# Patient Record
Sex: Male | Born: 1980
Health system: Southern US, Community
[De-identification: ages and names within clinical notes are randomized; demographics above are authoritative.]

## PROBLEM LIST (undated history)

## (undated) DIAGNOSIS — Z800 Family history of malignant neoplasm of digestive organs: Secondary | ICD-10-CM

## (undated) DIAGNOSIS — R0989 Other specified symptoms and signs involving the circulatory and respiratory systems: Secondary | ICD-10-CM

## (undated) DIAGNOSIS — Z808 Family history of malignant neoplasm of other organs or systems: Secondary | ICD-10-CM

## (undated) DIAGNOSIS — E785 Hyperlipidemia, unspecified: Secondary | ICD-10-CM

## (undated) DIAGNOSIS — Z803 Family history of malignant neoplasm of breast: Secondary | ICD-10-CM

## (undated) DIAGNOSIS — Z8481 Family history of carrier of genetic disease: Secondary | ICD-10-CM

## (undated) HISTORY — DX: Family history of carrier of genetic disease: Z84.81

## (undated) HISTORY — DX: Family history of malignant neoplasm of digestive organs: Z80.0

## (undated) HISTORY — DX: Hyperlipidemia, unspecified: E78.5

## (undated) HISTORY — DX: Family history of malignant neoplasm of breast: Z80.3

## (undated) HISTORY — DX: Other specified symptoms and signs involving the circulatory and respiratory systems: R09.89

## (undated) HISTORY — DX: Family history of malignant neoplasm of other organs or systems: Z80.8

## (undated) HISTORY — PX: SKIN GRAFT: SHX250

---

## 2012-06-22 ENCOUNTER — Emergency Department (HOSPITAL_COMMUNITY): Payer: Worker's Compensation

## 2012-06-22 ENCOUNTER — Encounter (HOSPITAL_COMMUNITY): Payer: Self-pay | Admitting: *Deleted

## 2012-06-22 ENCOUNTER — Emergency Department (HOSPITAL_COMMUNITY)
Admission: EM | Admit: 2012-06-22 | Discharge: 2012-06-22 | Disposition: A | Discharge status: Home / Self Care | Payer: Worker's Compensation | Attending: Emergency Medicine | Admitting: Emergency Medicine

## 2012-06-22 DIAGNOSIS — R55 Syncope and collapse: Secondary | ICD-10-CM | POA: Insufficient documentation

## 2012-06-22 DIAGNOSIS — R5383 Other fatigue: Secondary | ICD-10-CM | POA: Insufficient documentation

## 2012-06-22 DIAGNOSIS — R5381 Other malaise: Secondary | ICD-10-CM | POA: Insufficient documentation

## 2012-06-22 DIAGNOSIS — R296 Repeated falls: Secondary | ICD-10-CM | POA: Insufficient documentation

## 2012-06-22 DIAGNOSIS — S1093XA Contusion of unspecified part of neck, initial encounter: Secondary | ICD-10-CM | POA: Insufficient documentation

## 2012-06-22 DIAGNOSIS — S0003XA Contusion of scalp, initial encounter: Secondary | ICD-10-CM | POA: Insufficient documentation

## 2012-06-22 LAB — CBC WITH DIFFERENTIAL/PLATELET
Basophils Absolute: 0 10*3/uL (ref 0.0–0.1)
Basophils Relative: 0 % (ref 0–1)
Eosinophils Absolute: 0.3 10*3/uL (ref 0.0–0.7)
Hemoglobin: 14 g/dL (ref 13.0–17.0)
MCH: 30.6 pg (ref 26.0–34.0)
MCHC: 33.8 g/dL (ref 30.0–36.0)
Monocytes Relative: 9 % (ref 3–12)
Neutrophils Relative %: 52 % (ref 43–77)
RDW: 13.3 % (ref 11.5–15.5)

## 2012-06-22 LAB — BASIC METABOLIC PANEL
BUN: 17 mg/dL (ref 6–23)
Creatinine, Ser: 0.93 mg/dL (ref 0.50–1.35)
GFR calc Af Amer: >90 mL/min (ref >90)
GFR calc non Af Amer: >90 mL/min (ref >90)
Potassium: 3.9 mEq/L (ref 3.5–5.1)

## 2012-06-22 LAB — URINALYSIS, ROUTINE W REFLEX MICROSCOPIC
Bilirubin Urine: NEGATIVE
Ketones, ur: NEGATIVE mg/dL
Leukocytes, UA: NEGATIVE
Nitrite: NEGATIVE
Protein, ur: 30 mg/dL — AB
Urobilinogen, UA: 1 mg/dL (ref 0.0–1.0)
pH: 7 (ref 5.0–8.0)

## 2012-06-22 LAB — TROPONIN I: Troponin I: <0.3 ng/mL (ref <0.30)

## 2012-06-22 MED ORDER — SODIUM CHLORIDE 0.9 % IV SOLN
INTRAVENOUS | Status: DC
  Administered 2012-06-22: 1000 mL via INTRAVENOUS

## 2012-06-22 MED ORDER — SODIUM CHLORIDE 0.9 % IV BOLUS (SEPSIS)
1000.0000 mL | Freq: Once | INTRAVENOUS | Status: AC
  Administered 2012-06-22: 1000 mL via INTRAVENOUS

## 2012-06-22 NOTE — ED Provider Notes (Signed)
History     CSN: [id number]  Arrival date & time 06/22/12  1753   First MD Initiated Contact with Patient 06/22/12 1755      Chief Complaint  Patient presents with  . Near Syncope    (Consider location/radiation/quality/duration/timing/severity/associated sxs/prior treatment) HPI Comments: Stuart Newman is a 31 y.o. Male who was at work today, when his family members came to visit him. At that time, they noticed him walking then falling, striking his head on a ping-pong table immediately. He tried to get up and fell, again. He did, that a third time. He hit his head on the floor, and on the door of a Youth worker. There is no report of loss of consciousness. Reportedly, his blood pressure was taken and was "70 over palp.". Patient feels back to normal. His wife states that he has been working too much. Patient states he works about 100 hours a week. Today, to eat; he has had only a protein bar and a hot dog. There are no known aggravating or palliative factors. He has not had episodes like this previously.  The history is provided by the patient and a relative.    History reviewed. No pertinent past medical history.  History reviewed. No pertinent past surgical history.  No family history on file.  History  Substance Use Topics  . Smoking status: Not on file  . Smokeless tobacco: Not on file  . Alcohol Use: Not on file      Review of Systems  All other systems reviewed and are negative.    Allergies  Penicillins  Home Medications  No current outpatient prescriptions on file.  BP 125/57  Pulse 81  Temp 98.1 F (36.7 C) (Oral)  Resp 21  SpO2 100%  Physical Exam  Nursing note and vitals reviewed. Constitutional: He is oriented to person, place, and time. He appears well-developed and well-nourished.       He is robust appearing  HENT:  Head: Normocephalic.  Right Ear: External ear normal.  Left Ear: External ear normal.       Small contusions left and right  parietal, no abrasion or laceration  Eyes: Conjunctivae and EOM are normal. Pupils are equal, round, and reactive to light.  Neck: Normal range of motion and phonation normal. Neck supple.  Cardiovascular: Normal rate, regular rhythm, normal heart sounds and intact distal pulses.   Pulmonary/Chest: Effort normal and breath sounds normal. He exhibits no bony tenderness.  Abdominal: Soft. Normal appearance. There is no tenderness.  Musculoskeletal: Normal range of motion.  Neurological: He is alert and oriented to person, place, and time. He has normal strength. No cranial nerve deficit or sensory deficit. He exhibits normal muscle tone. Coordination normal.  Skin: Skin is warm, dry and intact.  Psychiatric: He has a normal mood and affect. His behavior is normal. Judgment and thought content normal.    ED Course  Procedures (including critical care time)      Date: 06/22/2012  Rate: 82  Rhythm: normal sinus rhythm  QRS Axis: normal  Intervals: normal  ST/T Wave abnormalities: normal  Conduction Disutrbances:none  Narrative Interpretation:   Old EKG Reviewed: none available   Labs Reviewed  URINALYSIS, ROUTINE W REFLEX MICROSCOPIC - Abnormal; Notable for the following:    APPearance CLOUDY (*)     Protein, ur 30 (*)     All other components within normal limits  BASIC METABOLIC PANEL - Abnormal; Notable for the following:    Glucose, Bld 104 (*)  All other components within normal limits  URINE MICROSCOPIC-ADD ON - Abnormal; Notable for the following:    Casts GRANULAR CAST (*)     All other components within normal limits  CBC WITH DIFFERENTIAL  TROPONIN I   Dg Chest 2 View  06/22/2012  *RADIOLOGY REPORT*  Clinical Data: 31 year old male with weakness.  Fall.  CHEST - 2 VIEW  Comparison: None.  Findings: Lung volumes are within normal limits.  Mild eventration of the right hemidiaphragm.  Cardiac size and mediastinal contours are within normal limits.  Visualized  tracheal air column is within normal limits.  No pneumothorax, pulmonary edema, pleural effusion or confluent pulmonary opacity.  Mild increased interstitial markings diffusely, might be related to technique.  EKG leads and wires overlie the chest. No acute osseous abnormality identified.  IMPRESSION: No acute cardiopulmonary abnormality.   Original Report Authenticated By: Harley Hallmark, M.D.    Ct Head Wo Contrast  06/22/2012  *RADIOLOGY REPORT*  Clinical Data: Near-syncope.  Hypotensive.  No reported head injury.  CT HEAD WITHOUT CONTRAST  Technique:  Contiguous axial images were obtained from the base of the skull through the vertex without contrast.  Comparison: None.  Findings: There is no evidence for acute infarction, intracranial hemorrhage, mass lesion, hydrocephalus, or extra-axial fluid. There is no atrophy or white matter disease.  Calvarium is intact. Clear sinuses and mastoids.  Note is made of a right parieto-occipital scalp hematoma (arrows). Correlate clinically.  IMPRESSION: No skull fracture or intracranial hemorrhage.  No acute or focal brain lesion.  No extra-axial fluid collection.  Right parieto-occipital scalp hematoma; see comments above.   Original Report Authenticated By: Elsie Stain, M.D.      1. Syncope   2. Fatigue       MDM  Syncope, and fatigue, likely due to to a heavy work schedule an appropriate eating. Patient is improved in emergency department. Doubt ACS, PE, metabolic instability, or pending vascular collapse.   Plan: Home Medications- none; Home Treatments- 3 regular meals a day and rest more; Recommended follow up- PCP of choice prn        Flint Melter, MD 06/23/12 [phone]

## 2012-06-22 NOTE — ED Notes (Signed)
Pt reports 2 episodes of syncope today .  Pt fell hitting the back of head . A small abrasion on lt side of head. Unsure if LOC occurred.

## 2012-11-22 IMAGING — CT CT HEAD W/O CM
1 series · 16 of 30 positions shown, 20 images · non-contrast
Comparison: None.

CLINICAL DATA: Near-syncope.  Hypotensive.  No reported head
injury.

CT HEAD WITHOUT CONTRAST
TECHNIQUE: Contiguous axial images were obtained from the base of
the skull through the vertex without contrast.

[Series 2: head routine 4.8 h37s · axial · 0.43mm/px · z∈[-133,-5]mm · 16 of 30 slices shown, 20 images]
[im 2/30  brain]
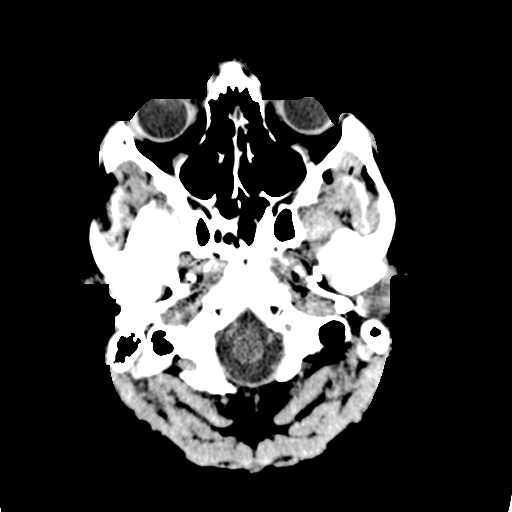
[im 2/30  bone]
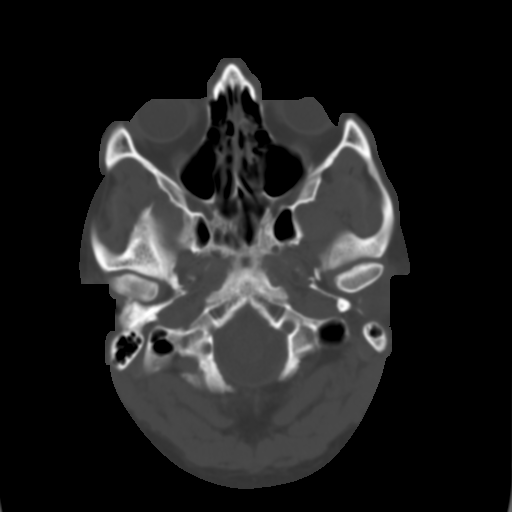
[im 4/30  brain]
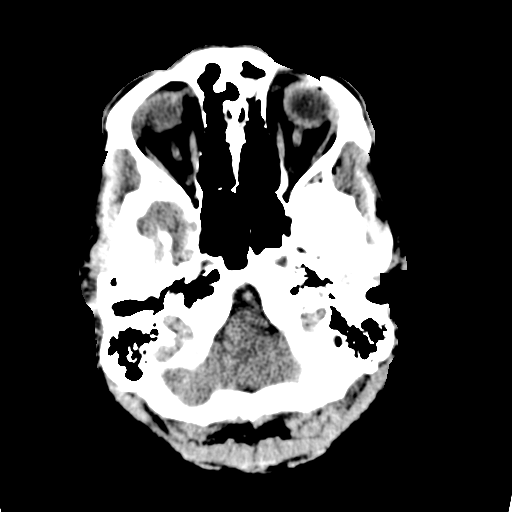
[im 6/30  brain]
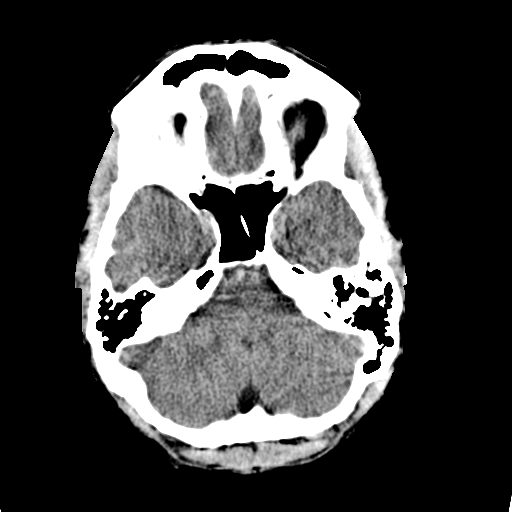
[im 8/30  brain]
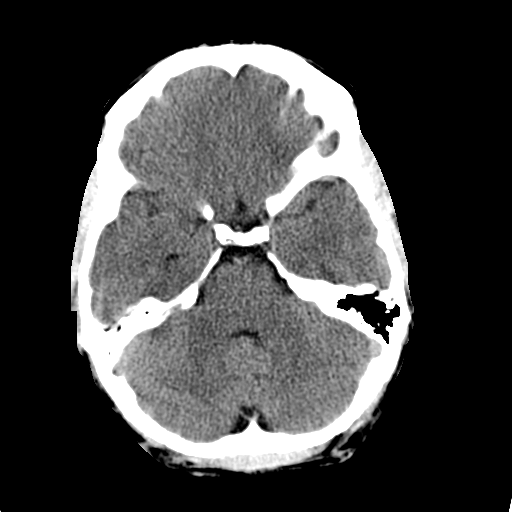
[im 9/30  brain]
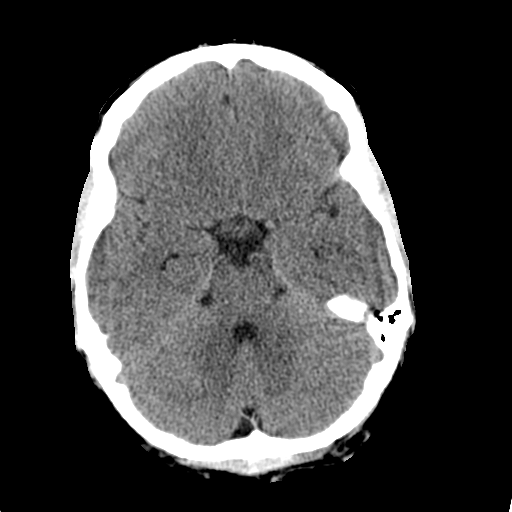
[im 9/30  bone]
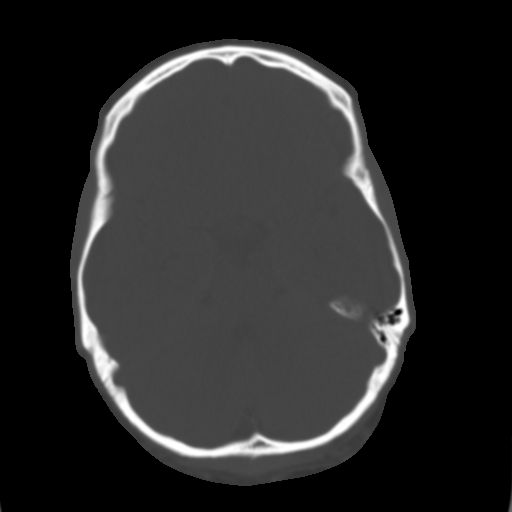
[im 11/30  brain]
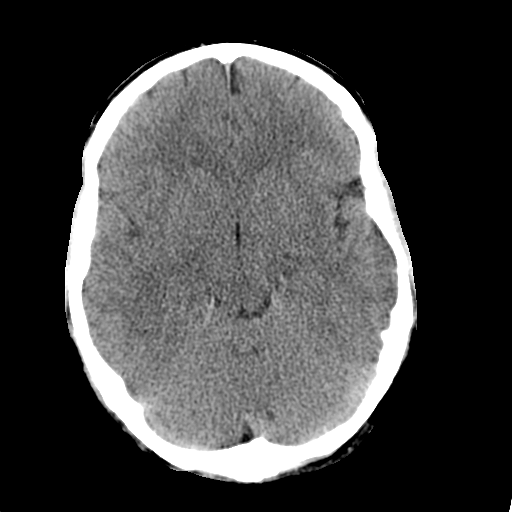
[im 13/30  brain]
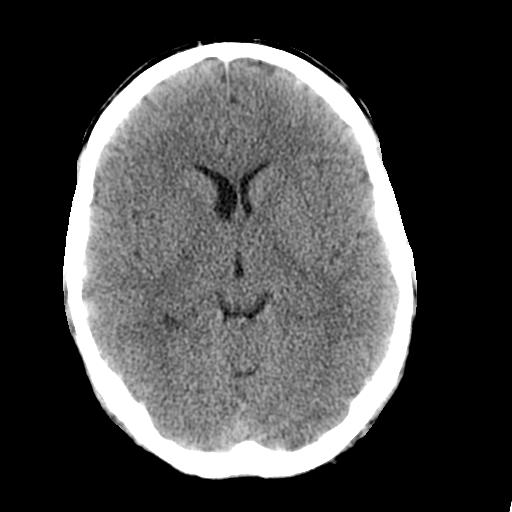
[im 15/30  brain]
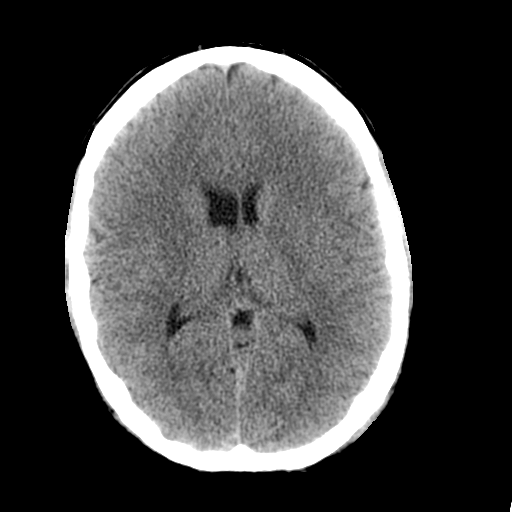
[im 16/30  brain]
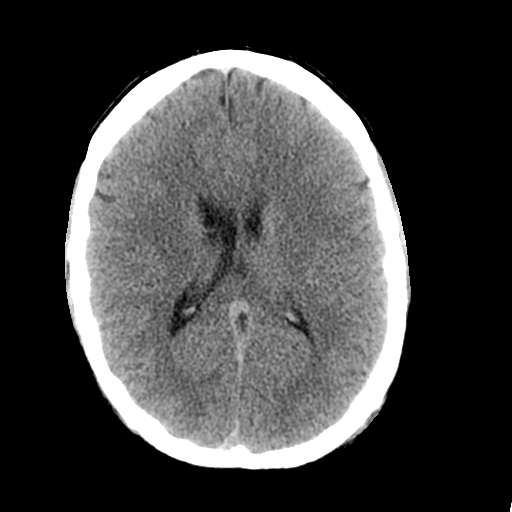
[im 16/30  bone]
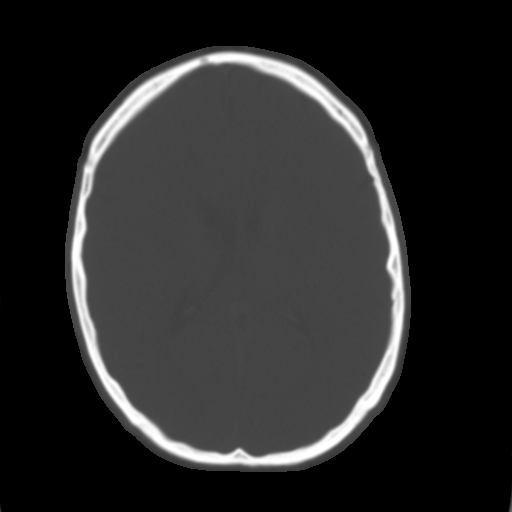
[im 18/30  brain]
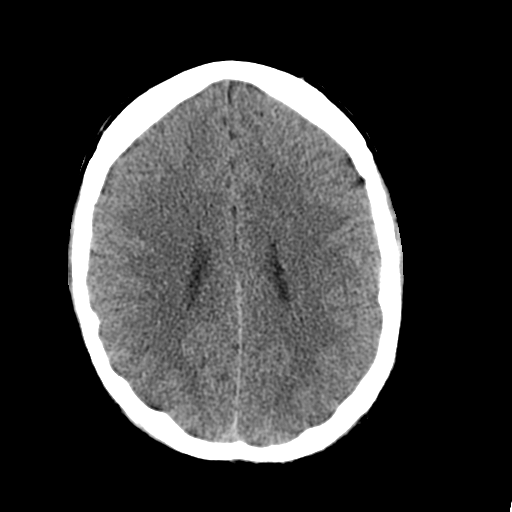
[im 20/30  brain]
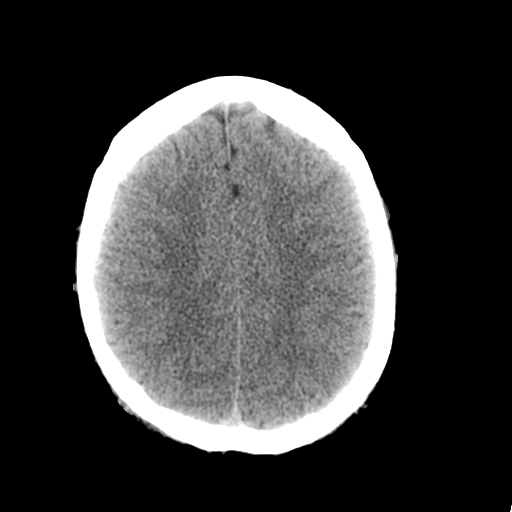
[im 22/30  brain]
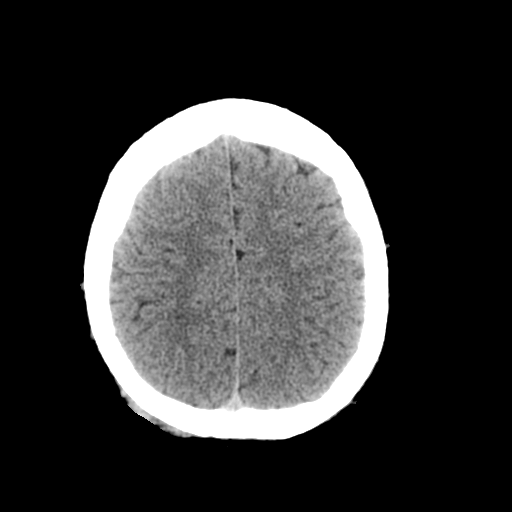
[im 23/30  brain]
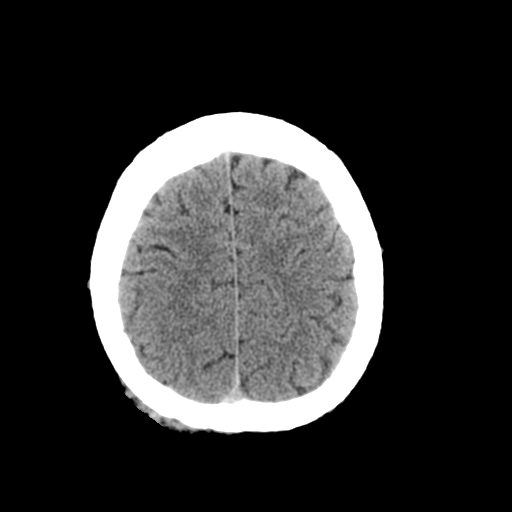
[im 23/30  bone]
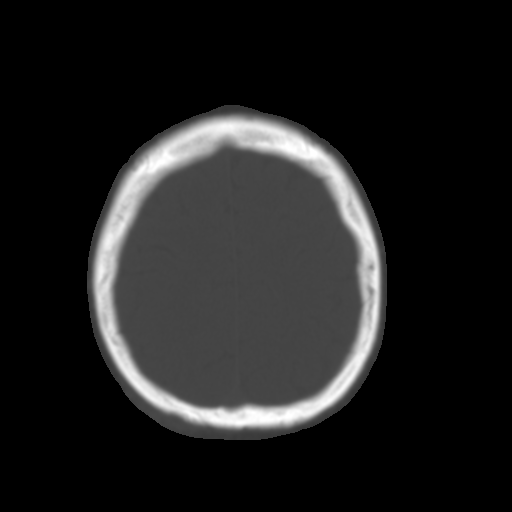
[im 25/30  brain]
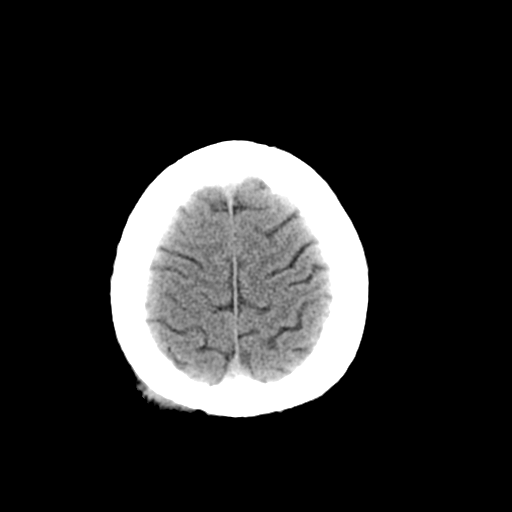
[im 27/30  brain]
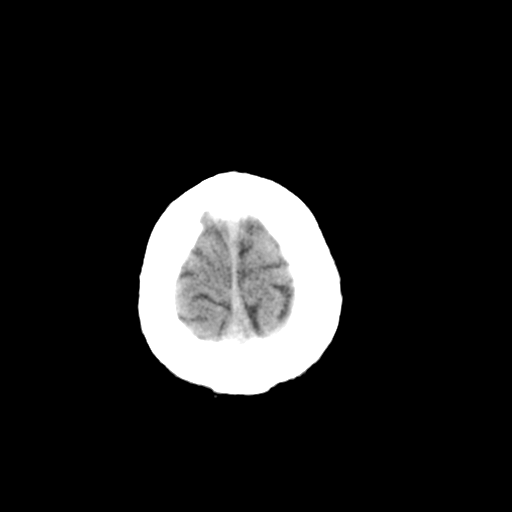
[im 29/30  brain]
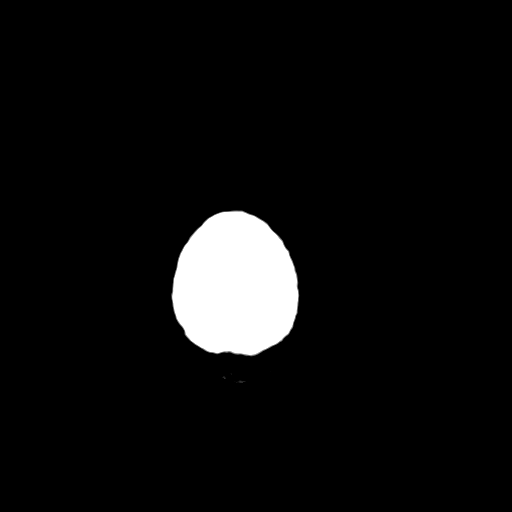

[16 of 30 positions shown; findings below may reference images not displayed]

FINDINGS: There is no evidence for acute infarction, intracranial
hemorrhage, mass lesion, hydrocephalus, or extra-axial fluid.
There is no atrophy or white matter disease.  Calvarium is intact.
Clear sinuses and mastoids.

Note is made of a right parieto-occipital scalp hematoma (arrows).
Correlate clinically.
IMPRESSION: No skull fracture or intracranial hemorrhage.  No acute or focal
brain lesion.  No extra-axial fluid collection.

Right parieto-occipital scalp hematoma; see comments above.

## 2015-02-22 ENCOUNTER — Ambulatory Visit (INDEPENDENT_AMBULATORY_CARE_PROVIDER_SITE_OTHER): Payer: 59 | Admitting: Physician Assistant

## 2015-02-22 VITALS — BP 124/88 | HR 100 | Temp 98.9°F | Ht 75.0 in | Wt 251.0 lb

## 2015-02-22 DIAGNOSIS — J019 Acute sinusitis, unspecified: Secondary | ICD-10-CM | POA: Diagnosis not present

## 2015-02-22 MED ORDER — LEVOFLOXACIN 500 MG PO TABS
750.0000 mg | ORAL_TABLET | Freq: Every day | ORAL | Status: DC
Start: 2015-02-22 — End: 2016-07-07

## 2015-02-22 NOTE — Patient Instructions (Signed)
Please take the levofloxacin once daily for 5 days.  Be aware there is a small risk of tendon rupture with this medication.  Please come back to see [place] if you're not feeling better in 3-4 days.   Sinusitis Sinusitis is redness, soreness, and inflammation of the paranasal sinuses. Paranasal sinuses are air pockets within the bones of your face (beneath the eyes, the middle of the forehead, or above the eyes). In healthy paranasal sinuses, mucus is able to drain out, and air is able to circulate through them by way of your nose. However, when your paranasal sinuses are inflamed, mucus and air can become trapped. This can allow bacteria and other germs to grow and cause infection. Sinusitis can develop quickly and last only a short time (acute) or continue over a long period (chronic). Sinusitis that lasts for more than 12 weeks is considered chronic.  CAUSES  Causes of sinusitis include:  Allergies.  Structural abnormalities, such as displacement of the cartilage that separates your nostrils (deviated septum), which can decrease the air flow through your nose and sinuses and affect sinus drainage.  Functional abnormalities, such as when the small hairs (cilia) that line your sinuses and help remove mucus do not work properly or are not present. SIGNS AND SYMPTOMS  Symptoms of acute and chronic sinusitis are the same. The primary symptoms are pain and pressure around the affected sinuses. Other symptoms include:  Upper toothache.  Earache.  Headache.  Bad breath.  Decreased sense of smell and taste.  A cough, which worsens when you are lying flat.  Fatigue.  Fever.  Thick drainage from your nose, which often is green and may contain pus (purulent).  Swelling and warmth over the affected sinuses. DIAGNOSIS  Your health care provider will perform a physical exam. During the exam, your health care provider may:  Look in your nose for signs of abnormal growths in your nostrils (nasal  polyps).  Tap over the affected sinus to check for signs of infection.  View the inside of your sinuses (endoscopy) using an imaging device that has a light attached (endoscope). If your health care provider suspects that you have chronic sinusitis, one or more of the following tests may be recommended:  Allergy tests.  Nasal culture. A sample of mucus is taken from your nose, sent to a lab, and screened for bacteria.  Nasal cytology. A sample of mucus is taken from your nose and examined by your health care provider to determine if your sinusitis is related to an allergy. TREATMENT  Most cases of acute sinusitis are related to a viral infection and will resolve on their own within 10 days. Sometimes medicines are prescribed to help relieve symptoms (pain medicine, decongestants, nasal steroid sprays, or saline sprays).  However, for sinusitis related to a bacterial infection, your health care provider will prescribe antibiotic medicines. These are medicines that will help kill the bacteria causing the infection.  Rarely, sinusitis is caused by a fungal infection. In theses cases, your health care provider will prescribe antifungal medicine. For some cases of chronic sinusitis, surgery is needed. Generally, these are cases in which sinusitis recurs more than 3 times per year, despite other treatments. HOME CARE INSTRUCTIONS   Drink plenty of water. Water helps thin the mucus so your sinuses can drain more easily.  Use a humidifier.  Inhale steam 3 to 4 times a day (for example, sit in the bathroom with the shower running).  Apply a warm, moist washcloth to  your face 3 to 4 times a day, or as directed by your health care provider.  Use saline nasal sprays to help moisten and clean your sinuses.  Take medicines only as directed by your health care provider.  If you were prescribed either an antibiotic or antifungal medicine, finish it all even if you start to feel better. SEEK IMMEDIATE  MEDICAL CARE IF:  You have increasing pain or severe headaches.  You have nausea, vomiting, or drowsiness.  You have swelling around your face.  You have vision problems.  You have a stiff neck.  You have difficulty breathing. MAKE SURE YOU:   Understand these instructions.  Will watch your condition.  Will get help right away if you are not doing well or get worse. Document Released: 10/16/2005 Document Revised: 03/02/2014 Document Reviewed: 10/31/2011 [facility] Patient Information 2015 [employer], [place]. This information is not intended to replace advice given to you by your health care provider. Make sure you discuss any questions you have with your health care provider.

## 2015-02-22 NOTE — Progress Notes (Signed)
   Subjective:    Patient ID: Stuart Newman, male    DOB: 10/14/1981, 34 y.o.   MRN: [id number]  Chief Complaint  Patient presents with  . Sinusitis  . Sore Throat  . Cough    With Green/Yellowish Phelgm   There are no active problems to display for this patient.  Medications, allergies, past medical history, surgical history, family history, social history and problem list reviewed and updated.  HPI  [age] yom with no significant pmh presents with 10 day h/o congestion.   Sx started 10-11 days ago with head congestion, mild st, non prod cough. Lasted for few days then mildly resolved. Three days ago sx worsened with more congestion, increased nasal stuffiness. Thick yellow/green dc. Cough has increased past couple days. Mildly prod. Mild chills yest. No fevers.   Denies n/v, diarrhea, seasonal allergies. Has been using oral and nasal decongestant without much relief.   Review of Systems See HPI.     Objective:   Physical Exam  Constitutional: He appears well-developed and well-nourished.  Non-toxic appearance. He does not have a sickly appearance. He does not appear ill. No distress.  BP 124/88 mmHg  Pulse 100  Temp(Src) 98.9 F (37.2 C) (Oral)  Ht 6\' 3"  (1.905 m)  Wt 251 lb (113.853 kg)  BMI 31.37 kg/m2  SpO2 97%   HENT:  Right Ear: A middle ear effusion is present.  Left Ear: Tympanic membrane normal.  Nose: Mucosal edema and rhinorrhea present. Right sinus exhibits no maxillary sinus tenderness and no frontal sinus tenderness. Left sinus exhibits maxillary sinus tenderness. Left sinus exhibits no frontal sinus tenderness.  Mouth/Throat: Uvula is midline, oropharynx is clear and moist and mucous membranes are normal. No oropharyngeal exudate, posterior oropharyngeal edema, posterior oropharyngeal erythema or tonsillar abscesses.  Left maxillary ttp.   Pulmonary/Chest: Effort normal and breath sounds normal. No tachypnea. He has no decreased breath sounds. He has no  wheezes. He has no rhonchi. He has no rales.  Lymphadenopathy:       Head (right side): No submental, no submandibular and no tonsillar adenopathy present.       Head (left side): Submandibular adenopathy present. No submental and no tonsillar adenopathy present.    He has no cervical adenopathy.      Assessment & Plan:   [age] yom with no significant pmh presents with 10 day h/o congestion.   Acute sinusitis, recurrence not specified, unspecified location - Plan: levofloxacin (LEVAQUIN) 500 MG tablet --possible bacterial etiology at day 11 sx and with worsening past few days --type I pcn allergy --> levofloxacin 750 mg qd 5 days --sudafed, mucinex with increased water intake, tylenol as needed for st --rest, fluids --declines cough medication as working long hrs next couple days, will take otc cough med as is not very bothersome  Donnajean Lopesodd M. Soleil Mas, PA-C Physician Assistant-Certified [facility]  02/22/2015 8:25 PM

## 2016-07-07 ENCOUNTER — Ambulatory Visit (INDEPENDENT_AMBULATORY_CARE_PROVIDER_SITE_OTHER): Payer: Commercial Managed Care - HMO | Admitting: Internal Medicine

## 2016-07-07 ENCOUNTER — Encounter: Payer: Self-pay | Admitting: Internal Medicine

## 2016-07-07 VITALS — BP 128/68 | HR 66 | Temp 98.4°F | Resp 16 | Ht 75.0 in | Wt 250.0 lb

## 2016-07-07 DIAGNOSIS — Z79899 Other long term (current) drug therapy: Secondary | ICD-10-CM

## 2016-07-07 DIAGNOSIS — Z0000 Encounter for general adult medical examination without abnormal findings: Secondary | ICD-10-CM | POA: Diagnosis not present

## 2016-07-07 DIAGNOSIS — R7989 Other specified abnormal findings of blood chemistry: Secondary | ICD-10-CM

## 2016-07-07 DIAGNOSIS — R945 Abnormal results of liver function studies: Principal | ICD-10-CM

## 2016-07-07 DIAGNOSIS — E785 Hyperlipidemia, unspecified: Secondary | ICD-10-CM

## 2016-07-07 LAB — BASIC METABOLIC PANEL WITH GFR
BUN: 12 mg/dL (ref 7–25)
CHLORIDE: 105 mmol/L (ref 98–110)
CO2: 23 mmol/L (ref 20–31)
Calcium: 9.7 mg/dL (ref 8.6–10.3)
Creat: 1.02 mg/dL (ref 0.60–1.35)
GFR, Est African American: >89 mL/min (ref >=60)
GFR, Est Non African American: >89 mL/min (ref >=60)
GLUCOSE: 88 mg/dL (ref 65–99)
POTASSIUM: 4.2 mmol/L (ref 3.5–5.3)
Sodium: 140 mmol/L (ref 135–146)

## 2016-07-07 LAB — LIPID PANEL
CHOLESTEROL: 197 mg/dL (ref 125–200)
HDL: 32 mg/dL — ABNORMAL LOW (ref >=40)
LDL CALC: 139 mg/dL — AB (ref <130)
TRIGLYCERIDES: 130 mg/dL (ref <150)
Total CHOL/HDL Ratio: 6.2 Ratio — ABNORMAL HIGH (ref <=5.0)
VLDL: 26 mg/dL (ref <30)

## 2016-07-07 LAB — CBC WITH DIFFERENTIAL/PLATELET
Basophils Absolute: 77 cells/uL (ref 0–200)
Basophils Relative: 1 %
EOS PCT: 3 %
Eosinophils Absolute: 231 cells/uL (ref 15–500)
HCT: 42.3 % (ref 38.5–50.0)
Hemoglobin: 14.3 g/dL (ref 13.2–17.1)
LYMPHS PCT: 58 %
Lymphs Abs: 4466 cells/uL (ref 850–3900)
MCH: 30 pg (ref 27.0–33.0)
MCHC: 33.8 g/dL (ref 32.0–36.0)
MCV: 88.7 fL (ref 80.0–100.0)
MONOS PCT: 10 %
MPV: 9.6 fL (ref 7.5–12.5)
Monocytes Absolute: 770 cells/uL (ref 200–950)
NEUTROS PCT: 28 %
Neutro Abs: 2156 cells/uL (ref 1500–7800)
PLATELETS: 228 10*3/uL (ref 140–400)
RBC: 4.77 MIL/uL (ref 4.20–5.80)
RDW: 14.2 % (ref 11.0–15.0)
WBC: 7.7 10*3/uL (ref 3.8–10.8)

## 2016-07-07 LAB — HEPATIC FUNCTION PANEL
ALBUMIN: 4.6 g/dL (ref 3.6–5.1)
ALT: 130 U/L — AB (ref 9–46)
AST: 46 U/L — AB (ref 10–40)
Alkaline Phosphatase: 83 U/L (ref 40–115)
Bilirubin, Direct: 0.1 mg/dL (ref <=0.2)
Indirect Bilirubin: 0.5 mg/dL (ref 0.2–1.2)
Total Bilirubin: 0.6 mg/dL (ref 0.2–1.2)
Total Protein: 7.6 g/dL (ref 6.1–8.1)

## 2016-07-07 LAB — GAMMA GT: GGT: 22 U/L (ref 7–51)

## 2016-07-07 NOTE — Progress Notes (Signed)
Annual Screening Comprehensive Examination   This very nice 35 y.o.male presents for complete physical and evaluation as a new patient.  He reports that he typically gets his physicals done through the city and his blood work showed elevated liver enzymes and high cholesterol.  He has no major health concerns otherwise.    He does not take any over the counter medications currently.    He does work for [employer].  He works out multiple different stations.  He is a Adult nurseluitenant.      Patient has no major health issues.  Patient reports no complaints at this time.   Finally, patient has history of Vitamin D Deficiency and last vitamin D was No results found for: VD25OH.  Currently on supplementation     No current outpatient prescriptions on file prior to visit.   No current facility-administered medications on file prior to visit.     Allergies  Allergen Reactions  . Penicillins Hives, Shortness Of Breath and Swelling    No past medical history on file.   There is no immunization history on file for this patient.  No past surgical history on file.  Family History  Problem Relation Age of Onset  . Hypertension Mother   . Hypertension Father   . Hyperlipidemia Father   . Hypertension Brother     Social History   Social History  . Marital status: Married    Spouse name: N/A  . Number of children: N/A  . Years of education: N/A   Occupational History  . Not on file.   Social History Main Topics  . Smoking status: Never Smoker  . Smokeless tobacco: Never Used  . Alcohol use 1.8 oz/week    3 Standard drinks or equivalent per week  . Drug use: No  . Sexual activity: Not on file   Other Topics Concern  . Not on file   Social History Narrative  . No narrative on file   Review of Systems  Constitutional: Negative for chills, fever and malaise/fatigue.  HENT: Negative for congestion, ear pain and sore throat.   Eyes: Negative.   Respiratory: Negative for cough,  shortness of breath and wheezing.   Cardiovascular: Negative for chest pain, palpitations and leg swelling.  Gastrointestinal: Negative for abdominal pain, blood in stool, constipation, diarrhea, heartburn and melena.  Genitourinary: Negative.   Skin: Negative.   Neurological: Negative for dizziness, sensory change, loss of consciousness and headaches.  Psychiatric/Behavioral: Negative for depression. The patient has insomnia (due to work, does perform shift work). The patient is not nervous/anxious.       Physical Exam  BP 128/68   Pulse 66   Temp 98.4 F (36.9 C) (Temporal)   Resp 16   Ht 6\' 3"  (1.905 m)   Wt 250 lb (113.4 kg)   SpO2 98%   BMI 31.25 kg/m   General Appearance: Well nourished and in no apparent distress. Eyes: PERRLA, EOMs, conjunctiva no swelling or erythema, normal fundi and vessels. Sinuses: No frontal/maxillary tenderness ENT/Mouth: EACs patent / TMs  nl. Nares clear without erythema, swelling, mucoid exudates. Oral hygiene is good. No erythema, swelling, or exudate. Tongue normal, non-obstructing. Tonsils not swollen or erythematous. Hearing normal.  Neck: Supple, thyroid normal. No bruits, nodes or JVD. Respiratory: Respiratory effort normal.  BS equal and clear bilateral without rales, rhonci, wheezing or stridor. Cardio: Heart sounds are normal with regular rate and rhythm and no murmurs, rubs or gallops. Peripheral pulses are normal and equal bilaterally  without edema. No aortic or femoral bruits. Chest: symmetric with normal excursions and percussion. Abdomen: Flat, soft, with bowl sounds. Nontender, no guarding, rebound, hernias, masses, or organomegaly.  Lymphatics: Non tender without lymphadenopathy.  Musculoskeletal: Full ROM all peripheral extremities, joint stability, 5/5 strength, and normal gait. Skin: Warm and dry without rashes, lesions, cyanosis, clubbing or  ecchymosis.  Neuro: Cranial nerves intact, reflexes equal bilaterally. Normal muscle  tone, no cerebellar symptoms. Sensation intact.  Pysch: Awake and oriented X 3, normal affect, Insight and Judgment appropriate.   Assessment and Plan   1. Elevated LFTs -consider gilberts syndrome vs. Possible fatty liver secondary to body habitus.  Will do further workup.  If labs show no cause will get RUQ ultrasound - Hepatitis Acute Panel - Gamma GT - Hepatic function panel  2. Medication management  - CBC with Differential/Platelet  3. Hyperlipidemia  - Lipid panel  Discussed and reviewed labs from last week and possible use of cholesterol medications.  He is up to date on vaccines.  Will see back in 1 year if ultrasound is normal or on as needed basis.       Continue prudent diet as discussed, weight control, regular exercise, and medications. Routine screening labs and tests as requested with regular follow-up as recommended.  Over 40 minutes of exam, counseling, chart review and critical decision making was performed

## 2016-07-08 ENCOUNTER — Other Ambulatory Visit: Payer: Self-pay | Admitting: Internal Medicine

## 2016-07-08 DIAGNOSIS — R945 Abnormal results of liver function studies: Principal | ICD-10-CM

## 2016-07-08 DIAGNOSIS — R7989 Other specified abnormal findings of blood chemistry: Secondary | ICD-10-CM

## 2016-07-08 LAB — HEPATITIS PANEL, ACUTE
HCV Ab: NEGATIVE
HEP A IGM: NONREACTIVE
HEP B C IGM: NONREACTIVE
Hepatitis B Surface Ag: NEGATIVE

## 2016-07-11 ENCOUNTER — Encounter: Payer: Self-pay | Admitting: Internal Medicine

## 2016-07-12 ENCOUNTER — Other Ambulatory Visit: Payer: Self-pay

## 2016-07-28 ENCOUNTER — Ambulatory Visit
Admission: RE | Admit: 2016-07-28 | Discharge: 2016-07-28 | Disposition: A | Discharge status: Home / Self Care | Payer: Self-pay | Source: Ambulatory Visit | Attending: Internal Medicine | Admitting: Internal Medicine

## 2016-07-28 DIAGNOSIS — R945 Abnormal results of liver function studies: Principal | ICD-10-CM

## 2016-07-28 DIAGNOSIS — R7989 Other specified abnormal findings of blood chemistry: Secondary | ICD-10-CM

## 2016-08-10 ENCOUNTER — Ambulatory Visit (INDEPENDENT_AMBULATORY_CARE_PROVIDER_SITE_OTHER): Payer: Commercial Managed Care - HMO | Admitting: Physician Assistant

## 2016-08-10 ENCOUNTER — Encounter: Payer: Self-pay | Admitting: Physician Assistant

## 2016-08-10 VITALS — BP 140/80 | HR 104 | Temp 97.7°F | Resp 16 | Ht 75.0 in | Wt 242.0 lb

## 2016-08-10 DIAGNOSIS — J209 Acute bronchitis, unspecified: Secondary | ICD-10-CM

## 2016-08-10 MED ORDER — AZITHROMYCIN 250 MG PO TABS: ORAL_TABLET | ORAL | 1 refills | Status: AC

## 2016-08-10 MED ORDER — PREDNISONE 20 MG PO TABS: ORAL_TABLET | ORAL | 0 refills | Status: DC

## 2016-08-10 NOTE — Patient Instructions (Signed)

## 2016-08-10 NOTE — Progress Notes (Signed)
   Subjective:    Patient ID: Stuart Newman, male    DOB: 12/28/1980, 35 y.o.   MRN: [id number]  HPI 35 y.o. WM presents with sinus/cough x 1 week.  States he has not been feeling well x 1 week, productive light green cough, chills, today left fire station and felt clamy, took BP and it was 100/60.   Blood pressure 140/80, pulse (!) 104, temperature 97.7 F (36.5 C), resp. rate 16, height 6\' 3"  (1.905 m), weight 242 lb (109.8 kg), SpO2 98 %.  Medications No current outpatient prescriptions on file prior to visit.   No current facility-administered medications on file prior to visit.     Problem list He  does not have a problem list on file.   Review of Systems  Constitutional: Positive for chills. Negative for diaphoresis and fever.  HENT: Positive for congestion, postnasal drip, rhinorrhea, sinus pressure and sore throat. Negative for ear pain, sneezing, trouble swallowing and voice change.   Eyes: Negative.   Respiratory: Positive for cough, shortness of breath and wheezing. Negative for chest tightness.   Cardiovascular: Negative.   Gastrointestinal: Negative.   Genitourinary: Negative.   Musculoskeletal: Negative.  Negative for neck pain.  Neurological: Negative.  Negative for headaches.       Objective:   Physical Exam  Constitutional: He is oriented to person, place, and time. He appears well-developed and well-nourished.  HENT:  Head: Normocephalic and atraumatic.  Right Ear: External ear normal.  Left Ear: External ear normal.  Nose: Nose normal.  Mouth/Throat: Oropharynx is clear and moist.  Eyes: Conjunctivae are normal. Pupils are equal, round, and reactive to light.  Neck: Normal range of motion. Neck supple.  Cardiovascular: Normal rate, regular rhythm and normal heart sounds.   No murmur heard. Pulmonary/Chest: Effort normal. No respiratory distress. He has wheezes. He has no rales. He exhibits no tenderness.  Abdominal: Soft. Bowel sounds are normal.   Lymphadenopathy:    He has no cervical adenopathy.  Neurological: He is alert and oriented to person, place, and time.  Skin: Skin is warm and dry.       Assessment & Plan:  1. Acute bronchitis, unspecified organism Will hold the zpak and take if she is not getting better, increase fluids, rest, cont allergy pill - azithromycin (ZITHROMAX) 250 MG tablet; Take 2 tablets (500 mg) on  Day 1,  followed by 1 tablet (250 mg) once daily on Days 2 through 5.  Dispense: 6 each; Refill: 1 - predniSONE (DELTASONE) 20 MG tablet; 2 tablets daily for 3 days, 1 tablet daily for 4 days.  Dispense: 10 tablet; Refill: 0

## 2016-10-11 ENCOUNTER — Encounter: Payer: Self-pay | Admitting: Internal Medicine

## 2016-10-11 ENCOUNTER — Ambulatory Visit (INDEPENDENT_AMBULATORY_CARE_PROVIDER_SITE_OTHER): Payer: Commercial Managed Care - HMO | Admitting: Internal Medicine

## 2016-10-11 VITALS — BP 124/66 | HR 90 | Temp 98.0°F | Resp 18 | Ht 75.0 in | Wt 250.0 lb

## 2016-10-11 DIAGNOSIS — Z79899 Other long term (current) drug therapy: Secondary | ICD-10-CM

## 2016-10-11 DIAGNOSIS — M25561 Pain in right knee: Secondary | ICD-10-CM

## 2016-10-11 DIAGNOSIS — E782 Mixed hyperlipidemia: Secondary | ICD-10-CM

## 2016-10-11 LAB — CBC WITH DIFFERENTIAL/PLATELET
BASOS PCT: 0 %
Basophils Absolute: 0 cells/uL (ref 0–200)
EOS ABS: 237 {cells}/uL (ref 15–500)
Eosinophils Relative: 3 %
HEMATOCRIT: 47.3 % (ref 38.5–50.0)
HEMOGLOBIN: 15.7 g/dL (ref 13.2–17.1)
LYMPHS ABS: 3871 {cells}/uL (ref 850–3900)
Lymphocytes Relative: 49 %
MCH: 29.7 pg (ref 27.0–33.0)
MCHC: 33.2 g/dL (ref 32.0–36.0)
MCV: 89.6 fL (ref 80.0–100.0)
MONO ABS: 790 {cells}/uL (ref 200–950)
MPV: 10 fL (ref 7.5–12.5)
Monocytes Relative: 10 %
NEUTROS ABS: 3002 {cells}/uL (ref 1500–7800)
Neutrophils Relative %: 38 %
Platelets: 252 10*3/uL (ref 140–400)
RBC: 5.28 MIL/uL (ref 4.20–5.80)
RDW: 14.5 % (ref 11.0–15.0)
WBC: 7.9 10*3/uL (ref 3.8–10.8)

## 2016-10-11 LAB — LIPID PANEL
CHOL/HDL RATIO: 5.7 ratio — AB (ref <5.0)
Cholesterol: 198 mg/dL (ref <200)
HDL: 35 mg/dL — AB (ref >40)
LDL CALC: 133 mg/dL — AB (ref <100)
Triglycerides: 151 mg/dL — ABNORMAL HIGH (ref <150)
VLDL: 30 mg/dL (ref <30)

## 2016-10-11 LAB — HEPATIC FUNCTION PANEL
ALK PHOS: 80 U/L (ref 40–115)
ALT: 19 U/L (ref 9–46)
AST: 17 U/L (ref 10–40)
Albumin: 4.5 g/dL (ref 3.6–5.1)
BILIRUBIN INDIRECT: 0.4 mg/dL (ref 0.2–1.2)
Bilirubin, Direct: 0.1 mg/dL (ref <=0.2)
TOTAL PROTEIN: 7.5 g/dL (ref 6.1–8.1)
Total Bilirubin: 0.5 mg/dL (ref 0.2–1.2)

## 2016-10-11 LAB — BASIC METABOLIC PANEL WITH GFR
BUN: 13 mg/dL (ref 7–25)
CHLORIDE: 105 mmol/L (ref 98–110)
CO2: 26 mmol/L (ref 20–31)
Calcium: 9.6 mg/dL (ref 8.6–10.3)
Creat: 1.01 mg/dL (ref 0.60–1.35)
GLUCOSE: 93 mg/dL (ref 65–99)
POTASSIUM: 4.1 mmol/L (ref 3.5–5.3)
Sodium: 140 mmol/L (ref 135–146)

## 2016-10-11 MED ORDER — MELOXICAM 15 MG PO TABS: 15.0000 mg | ORAL_TABLET | Freq: Every day | ORAL | 0 refills | Status: DC

## 2016-10-11 NOTE — Progress Notes (Signed)
Assessment and Plan:  Cholesterol: -Continue diet and exercise.  -Check cholesterol.   Right knee pain -mobic -if no relief then to ortho  Gilbert's syndrome -LFTs  Continue diet and meds as discussed. Further disposition pending results of labs.  HPI 35 y.o. male  presents for 3 month follow up with hyperlipidemia and gilbert's syndrome.    His blood pressure has been controlled at home, today their BP is BP: 124/66.   He does workout. He denies chest pain, shortness of breath, dizziness.   He is on cholesterol medication and denies myalgias. His cholesterol is at goal. The cholesterol last visit was:   Lab Results  Component Value Date   CHOL 197 07/07/2016   HDL 32 (L) 07/07/2016   LDLCALC 139 (H) 07/07/2016   TRIG 130 07/07/2016   CHOLHDL 6.2 (H) 07/07/2016    He reports that his right knee is bothering him slightly.  He thinks he has a meniscal tear.  He is not taking anything for it.  No swelling.  No locking.    Current Medications:  No current outpatient prescriptions on file prior to visit.   No current facility-administered medications on file prior to visit.     Medical History: No past medical history on file.  Allergies:  Allergies  Allergen Reactions  . Penicillins Hives, Shortness Of Breath and Swelling     Review of Systems:  Review of Systems  Constitutional: Negative for chills, fever and malaise/fatigue.  HENT: Negative for congestion, ear pain and sore throat.   Eyes: Negative.   Respiratory: Negative for cough, shortness of breath and wheezing.   Cardiovascular: Negative for chest pain, palpitations and leg swelling.  Gastrointestinal: Negative for abdominal pain, blood in stool, constipation, diarrhea, heartburn and melena.  Genitourinary: Negative.   Skin: Negative.   Neurological: Negative for dizziness, sensory change, loss of consciousness and headaches.  Psychiatric/Behavioral: Negative for depression. The patient is not  nervous/anxious and does not have insomnia.     Family history- Review and unchanged  Social history- Review and unchanged  Physical Exam: BP 124/66   Pulse 90   Temp 98 F (36.7 C) (Temporal)   Resp 18   Ht 6\' 3"  (1.905 m)   Wt 250 lb (113.4 kg)   BMI 31.25 kg/m  Wt Readings from Last 3 Encounters:  10/11/16 250 lb (113.4 kg)  08/10/16 242 lb (109.8 kg)  07/07/16 250 lb (113.4 kg)    General Appearance: Well nourished well developed, in no apparent distress. Eyes: PERRLA, EOMs, conjunctiva no swelling or erythema ENT/Mouth: Ear canals normal without obstruction, swelling, erythma, discharge.  TMs normal bilaterally.  Oropharynx moist, clear, without exudate, or postoropharyngeal swelling. Neck: Supple, thyroid normal,no cervical adenopathy  Respiratory: Respiratory effort normal, Breath sounds clear A&P without rhonchi, wheeze, or rale.  No retractions, no accessory usage. Cardio: RRR with no MRGs. Brisk peripheral pulses without edema.  Abdomen: Soft, + BS,  Non tender, no guarding, rebound, hernias, masses. Musculoskeletal: Full ROM, 5/5 strength, Normal gait Skin: Warm, dry without rashes, lesions, ecchymosis.  Neuro: Awake and oriented X 3, Cranial nerves intact. Normal muscle tone, no cerebellar symptoms. Psych: Normal affect, Insight and Judgment appropriate.    Terri Piedraourtney Forcucci, PA-C 9:27 AM [facility]

## 2016-12-28 IMAGING — US US ABDOMEN LIMITED
1 series · 14 of 25 positions shown · non-contrast
Comparison: None.

CLINICAL DATA: Elevated LFTs.

EXAM:
US ABDOMEN LIMITED - RIGHT UPPER QUADRANT

[Series 1: us abdomen limited · 0.35mm/px · 14 of 36 slices shown]
[im 1/36]
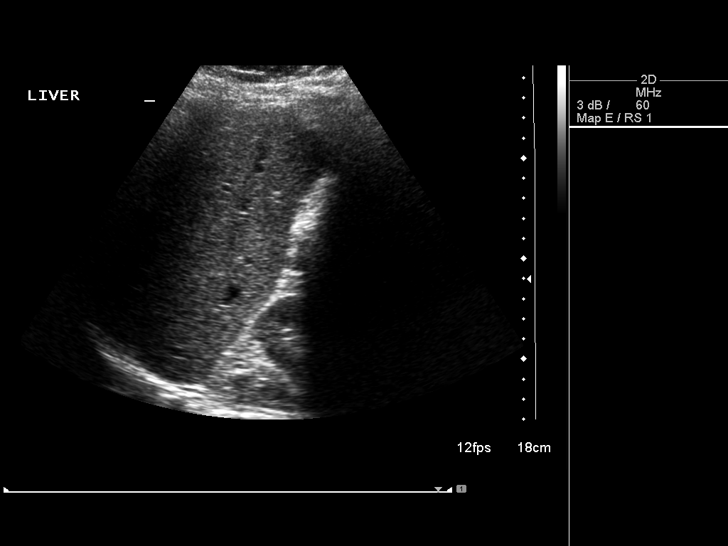
[im 3/36]
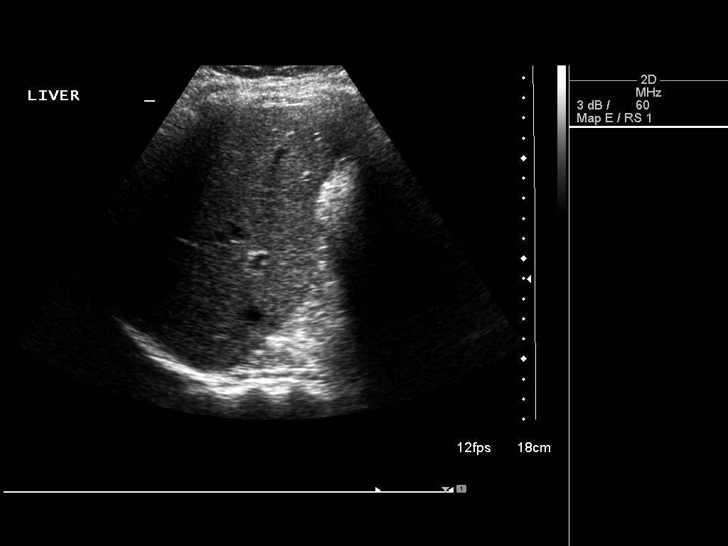
[im 6/36]
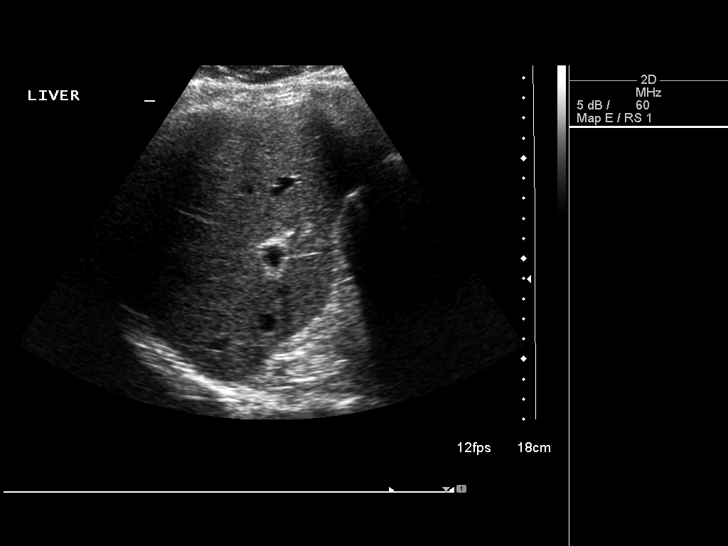
[im 9/36]
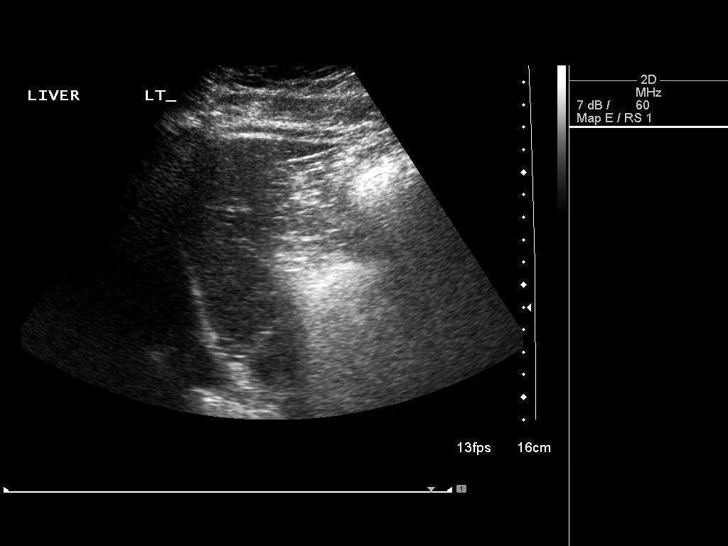
[im 12/36]
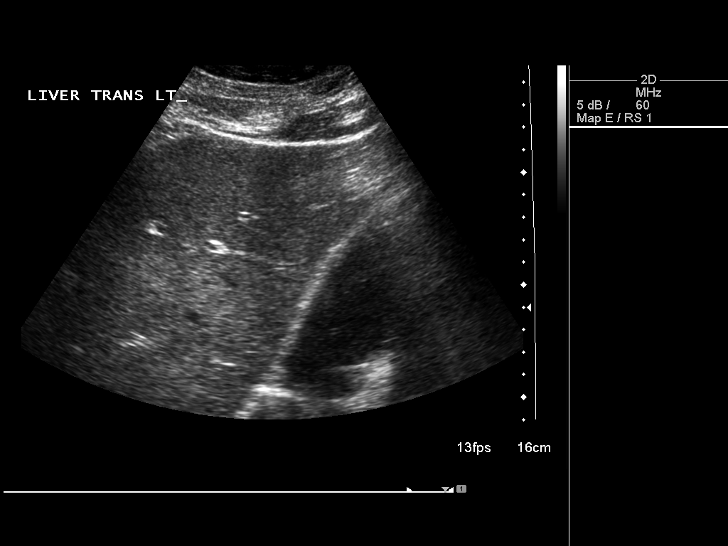
[im 14/36]
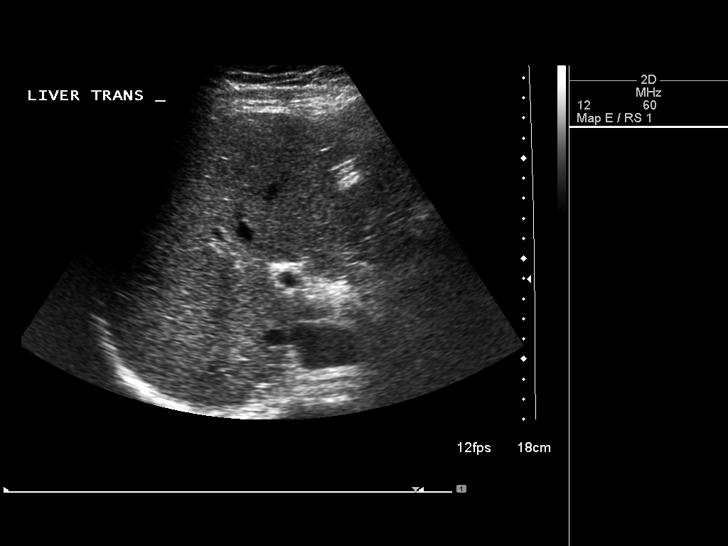
[im 17/36]
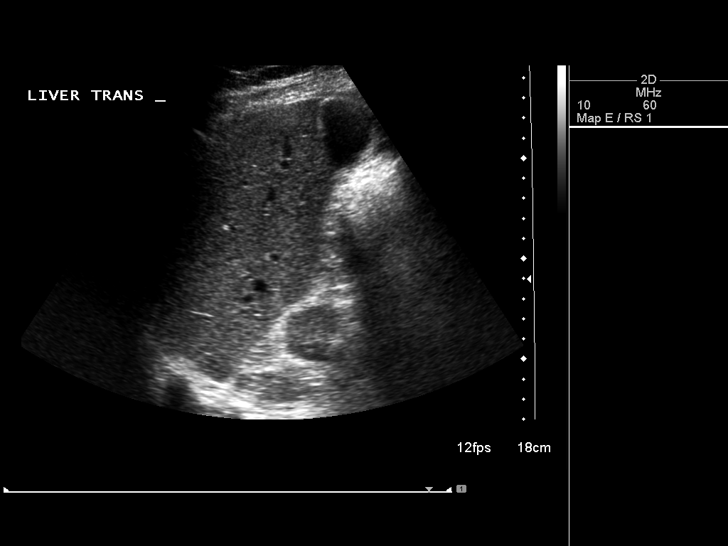
[im 19/36]
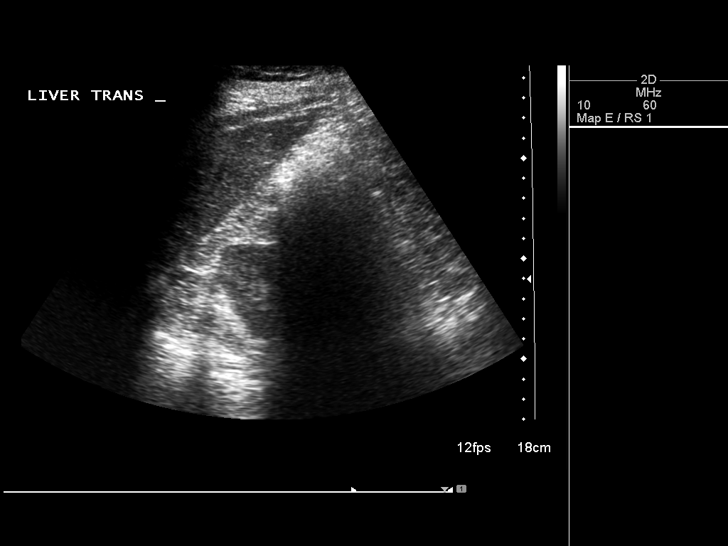
[im 22/36]
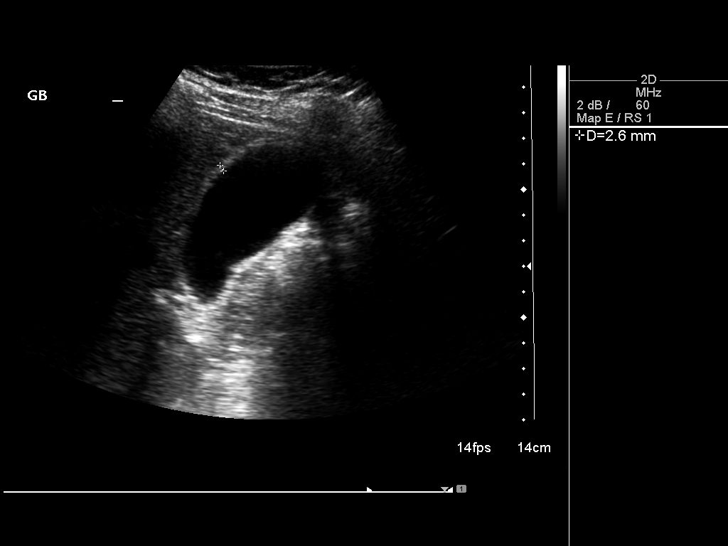
[im 24/36]
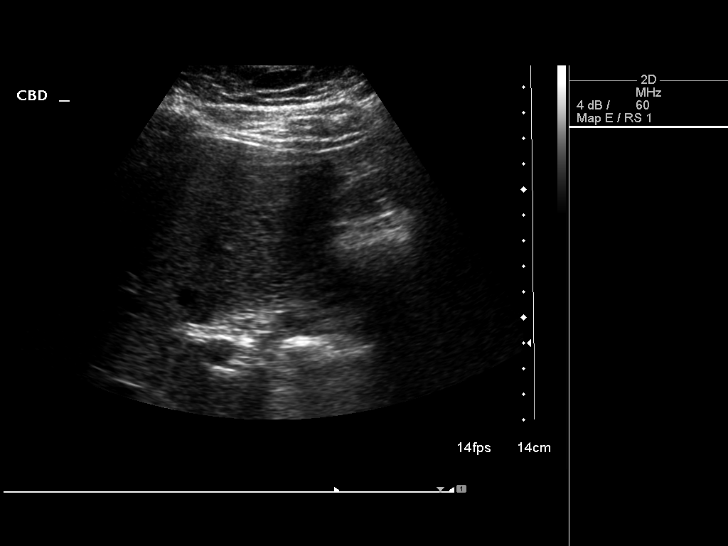
[im 27/36]
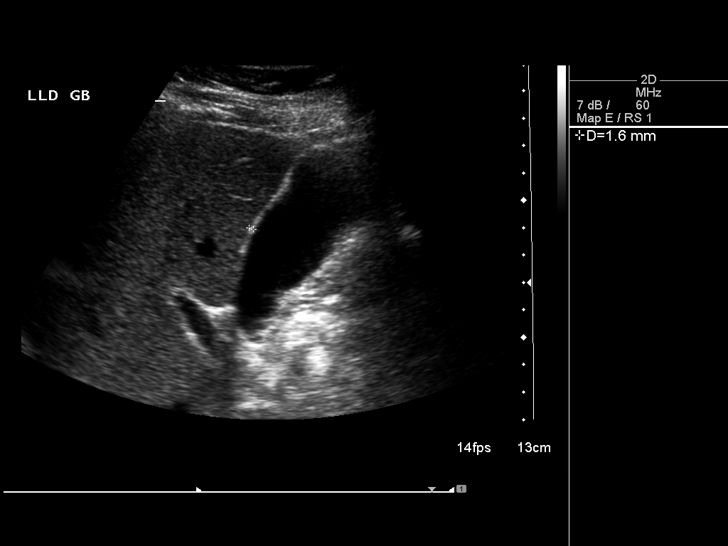
[im 30/36]
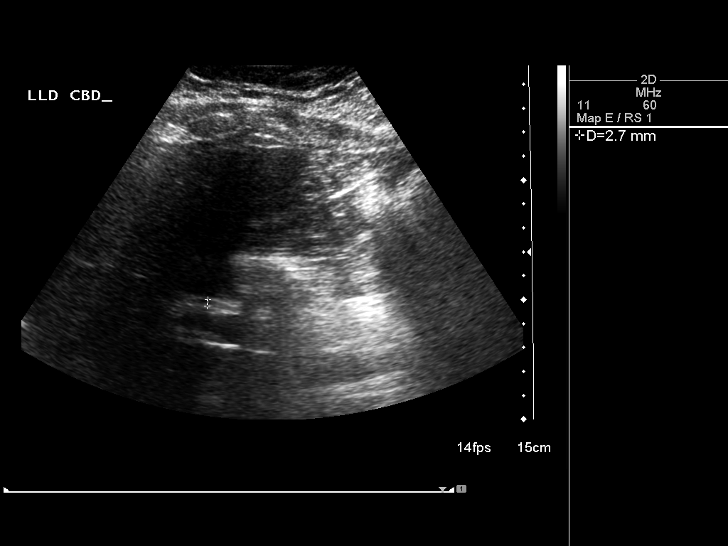
[im 33/36]
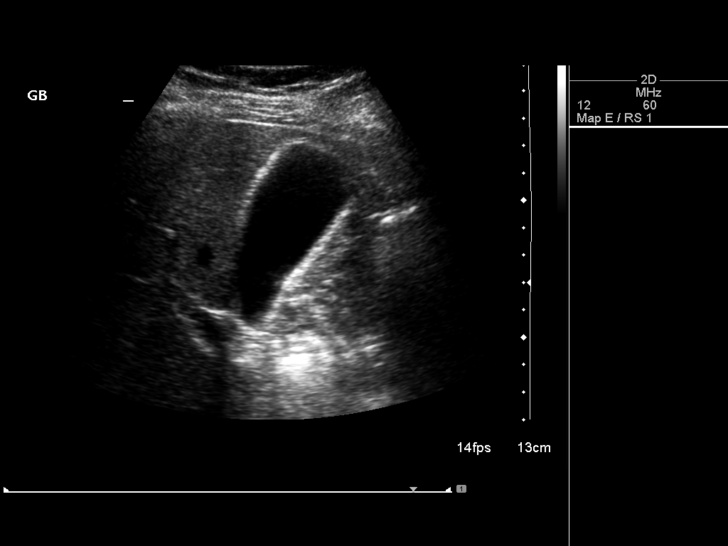
[im 36/36]
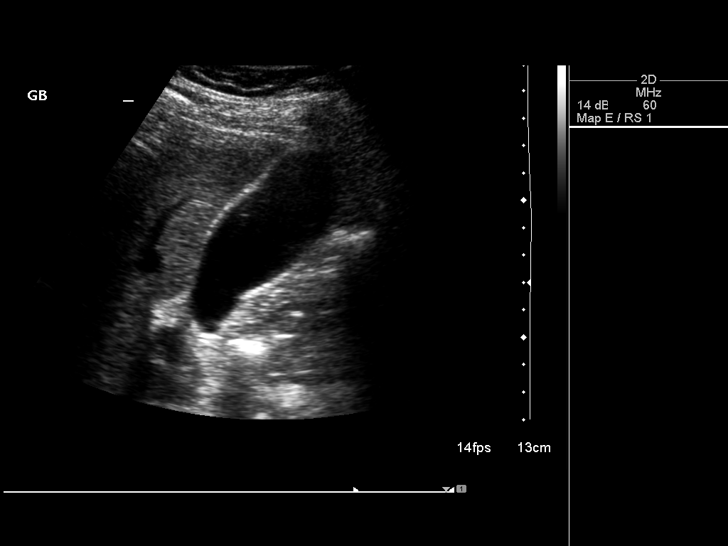

[14 of 25 positions shown; findings below may reference images not displayed]

FINDINGS: Gallbladder:

No gallstones or wall thickening visualized. No sonographic Murphy
sign noted by sonographer.

Common bile duct:

Diameter: 0.3 cm.

Liver:

No focal lesion identified. Within normal limits in parenchymal
echogenicity. Main portal vein is patent.
IMPRESSION: Negative right upper quadrant ultrasound.

## 2017-04-11 ENCOUNTER — Ambulatory Visit: Payer: Self-pay | Admitting: Internal Medicine

## 2017-04-23 ENCOUNTER — Ambulatory Visit (INDEPENDENT_AMBULATORY_CARE_PROVIDER_SITE_OTHER): Payer: 59 | Admitting: Physician Assistant

## 2017-04-23 ENCOUNTER — Encounter: Payer: Self-pay | Admitting: Physician Assistant

## 2017-04-23 VITALS — BP 130/72 | HR 80 | Temp 97.3°F | Resp 16 | Ht 75.0 in | Wt 254.2 lb

## 2017-04-23 DIAGNOSIS — Z6831 Body mass index (BMI) 31.0-31.9, adult: Secondary | ICD-10-CM

## 2017-04-23 DIAGNOSIS — E782 Mixed hyperlipidemia: Secondary | ICD-10-CM | POA: Diagnosis not present

## 2017-04-23 DIAGNOSIS — Z79899 Other long term (current) drug therapy: Secondary | ICD-10-CM

## 2017-04-23 DIAGNOSIS — E6609 Other obesity due to excess calories: Secondary | ICD-10-CM | POA: Diagnosis not present

## 2017-04-23 LAB — HEPATIC FUNCTION PANEL
ALBUMIN: 4.4 g/dL (ref 3.6–5.1)
ALK PHOS: 77 U/L (ref 40–115)
ALT: 16 U/L (ref 9–46)
AST: 15 U/L (ref 10–40)
BILIRUBIN INDIRECT: 0.3 mg/dL (ref 0.2–1.2)
BILIRUBIN TOTAL: 0.3 mg/dL (ref 0.2–1.2)
Bilirubin, Direct: 0 mg/dL (ref <=0.2)
Total Protein: 7.1 g/dL (ref 6.1–8.1)

## 2017-04-23 LAB — BASIC METABOLIC PANEL WITH GFR
BUN: 17 mg/dL (ref 7–25)
CHLORIDE: 105 mmol/L (ref 98–110)
CO2: 26 mmol/L (ref 20–31)
Calcium: 9.6 mg/dL (ref 8.6–10.3)
Creat: 1.09 mg/dL (ref 0.60–1.35)
GFR, EST NON AFRICAN AMERICAN: 87 mL/min (ref >=60)
GFR, Est African American: >89 mL/min (ref >=60)
GLUCOSE: 80 mg/dL (ref 65–99)
Potassium: 4.2 mmol/L (ref 3.5–5.3)
Sodium: 139 mmol/L (ref 135–146)

## 2017-04-23 LAB — CBC WITH DIFFERENTIAL/PLATELET
Basophils Absolute: 73 cells/uL (ref 0–200)
Basophils Relative: 1 %
EOS ABS: 292 {cells}/uL (ref 15–500)
Eosinophils Relative: 4 %
HEMATOCRIT: 43.8 % (ref 38.5–50.0)
Hemoglobin: 14.5 g/dL (ref 13.2–17.1)
LYMPHS ABS: 3066 {cells}/uL (ref 850–3900)
Lymphocytes Relative: 42 %
MCH: 30.2 pg (ref 27.0–33.0)
MCHC: 33.1 g/dL (ref 32.0–36.0)
MCV: 91.3 fL (ref 80.0–100.0)
MONO ABS: 803 {cells}/uL (ref 200–950)
MPV: 10.1 fL (ref 7.5–12.5)
Monocytes Relative: 11 %
NEUTROS ABS: 3066 {cells}/uL (ref 1500–7800)
Neutrophils Relative %: 42 %
Platelets: 243 10*3/uL (ref 140–400)
RBC: 4.8 MIL/uL (ref 4.20–5.80)
RDW: 13.6 % (ref 11.0–15.0)
WBC: 7.3 10*3/uL (ref 3.8–10.8)

## 2017-04-23 NOTE — Patient Instructions (Signed)
Simple math prevails.    1st - exercise does not produce significant weight loss - at best one converts fat into muscle , "bulks up", loses inches, but usually stays "weight neutral"     2nd - think of your body weightas a check book: If you eat more calories than you burn up - you save money or gain weight .... Or if you spend more money than you put in the check book, ie burn up more calories than you eat, then you lose weight     3rd - if you walk or run 1 mile, you burn up 100 calories - you have to burn up 3,500 calories to lose 1 pound, ie you have to walk/run 35 miles to lose 1 measly pound. So if you want to lose 10 #, then you have to walk/run 350 miles, so.... clearly exercise is not the solution.     4. So if you consume 1,500 calories, then you have to burn up the equivalent of 15 miles to stay weight neutral - It also stands to reason that if you consume 1,500 cal/day and don't lose weight, then you must be burning up about 1,500 cals/day to stay weight neutral.     5. If you really want to lose weight, you must cut your calorie intake 300 calories /day and at that rate you should lose about 1 # every 3 days.   6. Please purchase Dr Joel Fuhrman's book(s) "The End of Dieting" & "Eat to Live" . It has some great concepts and recipes.      We want weight loss that will last so you should lose 1-2 pounds a week.  THAT IS IT! Please pick THREE things a month to change. Once it is a habit check off the item. Then pick another three items off the list to become habits.  If you are already doing a habit on the list GREAT!  Cross that item off! o Don't drink your calories. Ie, alcohol, soda, fruit juice, and sweet tea.  o Drink more water. Drink a glass when you feel hungry or before each meal.  o Eat breakfast - Complex carb and protein (likeDannon light and fit yogurt, oatmeal, fruit, eggs, turkey bacon). o Measure your cereal.  Eat no more than one cup a day. (ie Kashi) o Eat an apple  a day. o Add a vegetable a day. o Try a new vegetable a month. o Use Pam! Stop using oil or butter to cook. o Don't finish your plate or use smaller plates. o Share your dessert. o Eat sugar free Jello for dessert or frozen grapes. o Don't eat 2-3 hours before bed. o Switch to whole wheat bread, pasta, and brown rice. o Make healthier choices when you eat out. No fries! o Pick baked chicken, NOT fried. o Don't forget to SLOW DOWN when you eat. It is not going anywhere.  o Take the stairs. o Park far away in the parking lot o Lift soup cans (or weights) for 10 minutes while watching TV. o Walk at work for 10 minutes during break. o Walk outside 1 time a week with your friend, kids, dog, or significant other. o Start a walking group at church. o Walk the mall as much as you can tolerate.  o Keep a food diary. o Weigh yourself daily. o Walk for 15 minutes 3 days per week. o Cook at home more often and eat out less.  If life happens and you   back to old habits, it is okay.  Just start over. You can do it!   If you experience chest pain, get short of breath, or tired during the exercise, please stop immediately and inform your doctor.   I think it is possible that you have sleep apnea. It can cause interrupted sleep, headaches, frequent awakenings, fatigue, dry mouth, fast/slow heart beats, memory issues, anxiety/depression, swelling, numbness tingling hands/feet, weight gain, shortness of breath, and the list goes on. Sleep apnea needs to be ruled out because if it is left untreated it does eventually lead to abnormal heart beats, lung failure or heart failure as well as increasing the risk of heart attack and stroke. There are masks you can wear OR a mouth piece that I can give you information about. Often times though people feel MUCH better after getting treatment.   Sleep Apnea  Sleep apnea is a sleep disorder characterized by abnormal pauses in breathing while you sleep. When your  breathing pauses, the level of oxygen in your blood decreases. This causes you to move out of deep sleep and into light sleep. As a result, your quality of sleep is poor, and the system that carries your blood throughout your body (cardiovascular system) experiences stress. If sleep apnea remains untreated, the following conditions can develop:  High blood pressure (hypertension).  Coronary artery disease.  Inability to achieve or maintain an erection (impotence).  Impairment of your thought process (cognitive dysfunction). There are three types of sleep apnea: 1. Obstructive sleep apnea--Pauses in breathing during sleep because of a blocked airway. 2. Central sleep apnea--Pauses in breathing during sleep because the area of the brain that controls your breathing does not send the correct signals to the muscles that control breathing. 3. Mixed sleep apnea--A combination of both obstructive and central sleep apnea.  RISK FACTORS The following risk factors can increase your risk of developing sleep apnea:  Being overweight.  Smoking.  Having narrow passages in your nose and throat.  Being of older age.  Being male.  Alcohol use.  Sedative and tranquilizer use.  Ethnicity. Among individuals younger than 35 years, African Americans are at increased risk of sleep apnea. SYMPTOMS   Difficulty staying asleep.  Daytime sleepiness and fatigue.  Loss of energy.  Irritability.  Loud, heavy snoring.  Morning headaches.  Trouble concentrating.  Forgetfulness.  Decreased interest in sex. DIAGNOSIS  In order to diagnose sleep apnea, your caregiver will perform a physical examination. Your caregiver may suggest that you take a home sleep test. Your caregiver may also recommend that you spend the night in a sleep lab. In the sleep lab, several monitors record information about your heart, lungs, and brain while you sleep. Your leg and arm movements and blood oxygen level are also  recorded. TREATMENT The following actions may help to resolve mild sleep apnea:  Sleeping on your side.   Using a decongestant if you have nasal congestion.   Avoiding the use of depressants, including alcohol, sedatives, and narcotics.   Losing weight and modifying your diet if you are overweight. There also are devices and treatments to help open your airway:  Oral appliances. These are custom-made mouthpieces that shift your lower jaw forward and slightly open your bite. This opens your airway.  Devices that create positive airway pressure. This positive pressure "splints" your airway open to help you breathe better during sleep. The following devices create positive airway pressure:  Continuous positive airway pressure (CPAP) device. The CPAP device creates  a continuous level of air pressure with an air pump. The air is delivered to your airway through a mask while you sleep. This continuous pressure keeps your airway open.  Nasal expiratory positive airway pressure (EPAP) device. The EPAP device creates positive air pressure as you exhale. The device consists of single-use valves, which are inserted into each nostril and held in place by adhesive. The valves create very little resistance when you inhale but create much more resistance when you exhale. That increased resistance creates the positive airway pressure. This positive pressure while you exhale keeps your airway open, making it easier to breath when you inhale again.  Bilevel positive airway pressure (BPAP) device. The BPAP device is used mainly in patients with central sleep apnea. This device is similar to the CPAP device because it also uses an air pump to deliver continuous air pressure through a mask. However, with the BPAP machine, the pressure is set at two different levels. The pressure when you exhale is lower than the pressure when you inhale.  Surgery. Typically, surgery is only done if you cannot comply with less  invasive treatments or if the less invasive treatments do not improve your condition. Surgery involves removing excess tissue in your airway to create a wider passage way. Document Released: 10/06/2002 Document Revised: 02/10/2013 Document Reviewed: 02/22/2012 [facility] Patient Information 2015 [employer] [place]. This information is not intended to replace advice given to you by your health care provider. Make sure you discuss any questions you have with your health care provider.

## 2017-04-23 NOTE — Progress Notes (Signed)
Assessment and Plan:  Cholesterol: -Continue diet and exercise.  -Check cholesterol at CPE   Elevated LFTs -LFTs Normal AB US Weight loss advised  Continue diet and meds as discussed. Further disposition pending results of labs. Future Appointments Date Time Provider Department Center  07/17/2017 3:00 PM Quentin Mullingollier, Carzell Saldivar, PA-C [id number] None    HPI 36 y.o. male  presents for 6 month follow up with hyperlipidemia and gilbert's syndrome.   His blood pressure has been controlled at home, today their BP is BP: 130/72.   He does workout. He denies chest pain, shortness of breath, dizziness.   He is NOT on cholesterol medication and denies myalgias. His cholesterol is at goal. The cholesterol last visit was:   Lab Results  Component Value Date   CHOL 198 10/11/2016   HDL 35 (L) 10/11/2016   LDLCALC 133 (H) 10/11/2016   TRIG 151 (H) 10/11/2016   CHOLHDL 5.7 (H) 10/11/2016   BMI is Body mass index is 31.77 kg/m., he is working on diet and exercise. Wt Readings from Last 3 Encounters:  04/23/17 254 lb 3.2 oz (115.3 kg)  10/11/16 250 lb (113.4 kg)  08/10/16 242 lb (109.8 kg)    Current Medications:  Current Outpatient Prescriptions on File Prior to Visit  Medication Sig Dispense Refill  . meloxicam (MOBIC) 15 MG tablet Take 1 tablet (15 mg total) by mouth daily. 90 tablet 0   No current facility-administered medications on file prior to visit.     Medical History: No past medical history on file.  Allergies:  Allergies  Allergen Reactions  . Penicillins Hives, Shortness Of Breath and Swelling     Review of Systems:  Review of Systems  Constitutional: Negative for chills, fever and malaise/fatigue.  HENT: Negative for congestion, ear pain and sore throat.   Eyes: Negative.   Respiratory: Negative for cough, shortness of breath and wheezing.   Cardiovascular: Negative for chest pain, palpitations and leg swelling.  Gastrointestinal: Negative for abdominal pain, blood  in stool, constipation, diarrhea, heartburn and melena.  Genitourinary: Negative.   Skin: Negative.   Neurological: Negative for dizziness, sensory change, loss of consciousness and headaches.  Psychiatric/Behavioral: Negative for depression. The patient is not nervous/anxious and does not have insomnia.     Family history- Review and unchanged  Social history- Review and unchanged  Physical Exam: BP 130/72   Pulse 80   Temp 97.3 F (36.3 C)   Resp 16   Ht 6\' 3"  (1.905 m)   Wt 254 lb 3.2 oz (115.3 kg)   SpO2 98%   BMI 31.77 kg/m  Wt Readings from Last 3 Encounters:  04/23/17 254 lb 3.2 oz (115.3 kg)  10/11/16 250 lb (113.4 kg)  08/10/16 242 lb (109.8 kg)    General Appearance: Well nourished well developed, in no apparent distress. Eyes: PERRLA, EOMs, conjunctiva no swelling or erythema ENT/Mouth: Ear canals normal without obstruction, swelling, erythma, discharge.  TMs normal bilaterally.  Oropharynx moist, clear, without exudate, or postoropharyngeal swelling. Neck: Supple, thyroid normal,no cervical adenopathy  Respiratory: Respiratory effort normal, Breath sounds clear A&P without rhonchi, wheeze, or rale.  No retractions, no accessory usage. Cardio: RRR with no MRGs. Brisk peripheral pulses without edema.  Abdomen: Soft, + BS,  Non tender, no guarding, rebound, hernias, masses. Musculoskeletal: Full ROM, 5/5 strength, Normal gait Skin: Warm, dry without rashes, lesions, ecchymosis.  Neuro: Awake and oriented X 3, Cranial nerves intact. Normal muscle tone, no cerebellar symptoms. Psych: Normal affect, Insight and Judgment  appropriate.    Quentin Mulling, PA-C 3:09 PM [facility]

## 2017-04-26 NOTE — Progress Notes (Signed)
Pt aware of lab results & voiced understanding of those results.

## 2017-07-11 ENCOUNTER — Encounter: Payer: Self-pay | Admitting: Internal Medicine

## 2017-07-16 NOTE — Progress Notes (Signed)
Complete Physical  Assessment and Plan:  . Discussed med's effects and SE's. Screening labs and tests as requested with regular follow-up as recommended. Over 40 minutes of exam, counseling, chart review and critical decision making was performed   HPI  This very nice 35 y.o.male presents for complete physical.  Patient has no major health issues.  Patient reports no complaints at this time.  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 He denies chest pain, shortness of breath, dizziness.  He is not on cholesterol medication and denies myalgias. His cholesterol is not at goal. The cholesterol last visit was:   Lab Results  Component Value Date   CHOL 198 10/11/2016   HDL 35 (L) 10/11/2016   LDLCALC 133 (H) 10/11/2016   TRIG 151 (H) 10/11/2016   CHOLHDL 5.7 (H) 10/11/2016   BMI is Body mass index is 31.89 kg/m., he is working on diet and exercise. He has been eating more veggies and trying keto diet.  Wt Readings from Last 3 Encounters:  07/17/17 248 lb 6.4 oz (112.7 kg)  04/23/17 254 lb 3.2 oz (115.3 kg)  10/11/16 250 lb (113.4 kg)     Current Medications:  Current Outpatient Prescriptions on File Prior to Visit  Medication Sig Dispense Refill  . meloxicam (MOBIC) 15 MG tablet Take 1 tablet (15 mg total) by mouth daily. 90 tablet 0   No current facility-administered medications on file prior to visit.    Health Maintenance:    There is no immunization history on file for this patient.  TD/TDAP: will get today Influenza: declines Pneumovax:   Allergies:  Allergies  Allergen Reactions  . Penicillins Hives, Shortness Of Breath and Swelling   Medical History:   does not have a problem list on file. Surgical History:  He  has a past surgical history that includes Skin graft. Family History:  Hisfamily history includes  Hyperlipidemia in his father; Hypertension in his brother, father, and mother. Social History:   reports that he has never smoked. He has never used smokeless tobacco. He reports that he drinks about 1.8 oz of alcohol per week . He reports that he does not use drugs.  Review of Systems: Review of Systems  Constitutional: Negative.   HENT: Negative.   Eyes: Negative.   Respiratory: Negative.   Cardiovascular: Negative.   Gastrointestinal: Negative.   Genitourinary: Negative.   Musculoskeletal: Negative.   Skin: Negative.     Physical Exam: Estimated body mass index is 31.89 kg/m as calculated from the following:   Height as of this encounter:  (1.88 m).   Weight as of this encounter: 248 lb 6.4 oz (112.7 kg). BP 124/80   Pulse 80   Temp (!) 97.3 F (36.3 C)   Resp 20   Ht  (1.88 m)   Wt 248 lb 6.4 oz (112.7 kg)   SpO2 98%   BMI 31.89 kg/m  General Appearance: Well nourished, in no apparent distress.  Eyes: PERRLA, EOMs, conjunctiva no swelling or erythema, normal fundi and vessels.  Sinuses: No Frontal/maxillary tenderness  ENT/Mouth: Ext aud canals clear, normal light reflex with TMs without erythema, bulging. Good dentition. No erythema, swelling, or exudate on post pharynx. Tonsils not swollen or erythematous. Hearing normal.  Neck: Supple, thyroid normal. No bruits  Respiratory: Respiratory effort normal, BS equal bilaterally without rales,  rhonchi, wheezing or stridor.  Cardio: RRR without murmurs, rubs or gallops. Brisk peripheral pulses without edema.  Chest: symmetric, with normal excursions and percussion.  Abdomen: Soft, nontender, no guarding, rebound, hernias, masses, or organomegaly.  Lymphatics: Non tender without lymphadenopathy.  Genitourinary: defer Musculoskeletal: Full ROM all peripheral extremities,5/5 strength, and normal gait.  Skin: Warm, dry without rashes, lesions, ecchymosis. Neuro: Cranial nerves intact, reflexes equal bilaterally.  Normal muscle tone, no cerebellar symptoms. Sensation intact.  Psych: Awake and oriented X 3, normal affect, Insight and Judgment appropriate.   EKG: defer  Quentin Mulling 3:40 PM [facility]

## 2017-07-17 ENCOUNTER — Ambulatory Visit (INDEPENDENT_AMBULATORY_CARE_PROVIDER_SITE_OTHER): Payer: 59 | Admitting: Physician Assistant

## 2017-07-17 ENCOUNTER — Encounter: Payer: Self-pay | Admitting: Physician Assistant

## 2017-07-17 VITALS — BP 124/80 | HR 80 | Temp 97.3°F | Resp 20 | Ht 74.0 in | Wt 248.4 lb

## 2017-07-17 DIAGNOSIS — E6609 Other obesity due to excess calories: Secondary | ICD-10-CM

## 2017-07-17 DIAGNOSIS — Z23 Encounter for immunization: Secondary | ICD-10-CM

## 2017-07-17 DIAGNOSIS — Z79899 Other long term (current) drug therapy: Secondary | ICD-10-CM

## 2017-07-17 DIAGNOSIS — E559 Vitamin D deficiency, unspecified: Secondary | ICD-10-CM

## 2017-07-17 DIAGNOSIS — Z0000 Encounter for general adult medical examination without abnormal findings: Secondary | ICD-10-CM | POA: Diagnosis not present

## 2017-07-17 DIAGNOSIS — Z6831 Body mass index (BMI) 31.0-31.9, adult: Secondary | ICD-10-CM

## 2017-07-17 DIAGNOSIS — E782 Mixed hyperlipidemia: Secondary | ICD-10-CM

## 2017-07-17 DIAGNOSIS — Z130 Encounter for screening for diseases of the blood and blood-forming organs and certain disorders involving the immune mechanism: Secondary | ICD-10-CM

## 2017-07-17 DIAGNOSIS — Z0001 Encounter for general adult medical examination with abnormal findings: Secondary | ICD-10-CM

## 2017-07-17 DIAGNOSIS — Z1389 Encounter for screening for other disorder: Secondary | ICD-10-CM

## 2017-07-17 DIAGNOSIS — Z131 Encounter for screening for diabetes mellitus: Secondary | ICD-10-CM

## 2017-07-18 ENCOUNTER — Other Ambulatory Visit: Payer: Self-pay | Admitting: Physician Assistant

## 2017-07-18 ENCOUNTER — Encounter: Payer: Self-pay | Admitting: Physician Assistant

## 2017-07-18 DIAGNOSIS — R0989 Other specified symptoms and signs involving the circulatory and respiratory systems: Secondary | ICD-10-CM | POA: Insufficient documentation

## 2017-07-18 DIAGNOSIS — E785 Hyperlipidemia, unspecified: Secondary | ICD-10-CM

## 2017-07-18 HISTORY — DX: Hyperlipidemia, unspecified: E78.5

## 2017-07-18 LAB — CBC WITH DIFFERENTIAL/PLATELET
BASOS PCT: 0.9 %
Basophils Absolute: 62 cells/uL (ref 0–200)
EOS ABS: 331 {cells}/uL (ref 15–500)
Eosinophils Relative: 4.8 %
HCT: 44.2 % (ref 38.5–50.0)
Hemoglobin: 14.9 g/dL (ref 13.2–17.1)
Lymphs Abs: 3174 cells/uL (ref 850–3900)
MCH: 30.5 pg (ref 27.0–33.0)
MCHC: 33.7 g/dL (ref 32.0–36.0)
MCV: 90.4 fL (ref 80.0–100.0)
MONOS PCT: 10.4 %
MPV: 10.9 fL (ref 7.5–12.5)
Neutro Abs: 2615 cells/uL (ref 1500–7800)
Neutrophils Relative %: 37.9 %
Platelets: 259 10*3/uL (ref 140–400)
RBC: 4.89 10*6/uL (ref 4.20–5.80)
RDW: 12.8 % (ref 11.0–15.0)
Total Lymphocyte: 46 %
WBC mixed population: 718 cells/uL (ref 200–950)
WBC: 6.9 10*3/uL (ref 3.8–10.8)

## 2017-07-18 LAB — HEPATIC FUNCTION PANEL
AG Ratio: 1.8 (calc) (ref 1.0–2.5)
ALT: 15 U/L (ref 9–46)
AST: 15 U/L (ref 10–40)
Albumin: 4.6 g/dL (ref 3.6–5.1)
Alkaline phosphatase (APISO): 76 U/L (ref 40–115)
BILIRUBIN INDIRECT: 0.3 mg/dL (ref 0.2–1.2)
BILIRUBIN TOTAL: 0.4 mg/dL (ref 0.2–1.2)
Bilirubin, Direct: 0.1 mg/dL (ref 0.0–0.2)
GLOBULIN: 2.6 g/dL (ref 1.9–3.7)
Total Protein: 7.2 g/dL (ref 6.1–8.1)

## 2017-07-18 LAB — BASIC METABOLIC PANEL WITH GFR
BUN: 16 mg/dL (ref 7–25)
CHLORIDE: 104 mmol/L (ref 98–110)
CO2: 27 mmol/L (ref 20–32)
Calcium: 9.6 mg/dL (ref 8.6–10.3)
Creat: 1.33 mg/dL (ref 0.60–1.35)
GFR, EST AFRICAN AMERICAN: 79 mL/min/{1.73_m2} (ref >=60)
GFR, EST NON AFRICAN AMERICAN: 68 mL/min/{1.73_m2} (ref >=60)
Glucose, Bld: 79 mg/dL (ref 65–99)
POTASSIUM: 4.2 mmol/L (ref 3.5–5.3)
Sodium: 140 mmol/L (ref 135–146)

## 2017-07-18 LAB — LIPID PANEL
Cholesterol: 212 mg/dL — ABNORMAL HIGH (ref <200)
HDL: 28 mg/dL — ABNORMAL LOW (ref >40)
LDL CHOLESTEROL (CALC): 148 mg/dL — AB
NON-HDL CHOLESTEROL (CALC): 184 mg/dL — AB (ref <130)
Total CHOL/HDL Ratio: 7.6 (calc) — ABNORMAL HIGH (ref <5.0)
Triglycerides: 224 mg/dL — ABNORMAL HIGH (ref <150)

## 2017-07-18 LAB — TSH: TSH: 0.79 m[IU]/L (ref 0.40–4.50)

## 2017-07-18 LAB — MICROALBUMIN / CREATININE URINE RATIO
Creatinine, Urine: 139 mg/dL (ref 20–320)
Microalb Creat Ratio: 3 mcg/mg creat (ref <30)
Microalb, Ur: 0.4 mg/dL

## 2017-07-18 LAB — IRON, TOTAL/TOTAL IRON BINDING CAP
%SAT: 33 % (ref 15–60)
IRON: 99 ug/dL (ref 50–180)
TIBC: 303 mcg/dL (calc) (ref 250–425)

## 2017-07-18 LAB — URINALYSIS W MICROSCOPIC + REFLEX CULTURE
BILIRUBIN URINE: NEGATIVE
Bacteria, UA: NONE SEEN /HPF
GLUCOSE, UA: NEGATIVE
HGB URINE DIPSTICK: NEGATIVE
Hyaline Cast: NONE SEEN /LPF
KETONES UR: NEGATIVE
Leukocyte Esterase: NEGATIVE
NITRITES URINE, INITIAL: NEGATIVE
PH: 7 (ref 5.0–8.0)
Protein, ur: NEGATIVE
RBC / HPF: NONE SEEN /HPF (ref 0–2)
Specific Gravity, Urine: 1.021 (ref 1.001–1.03)
Squamous Epithelial / LPF: NONE SEEN /HPF (ref <=5)
WBC UA: NONE SEEN /HPF (ref 0–5)

## 2017-07-18 LAB — VITAMIN B12: Vitamin B-12: 386 pg/mL (ref 200–1100)

## 2017-07-18 LAB — MAGNESIUM: Magnesium: 2.1 mg/dL (ref 1.5–2.5)

## 2017-07-18 LAB — HEMOGLOBIN A1C
Hgb A1c MFr Bld: 5.1 % of total Hgb (ref <5.7)
Mean Plasma Glucose: 100 (calc)
eAG (mmol/L): 5.5 (calc)

## 2017-07-18 LAB — VITAMIN D 25 HYDROXY (VIT D DEFICIENCY, FRACTURES): VIT D 25 HYDROXY: 33 ng/mL (ref 30–100)

## 2017-07-18 LAB — NO CULTURE INDICATED

## 2017-07-18 NOTE — Progress Notes (Signed)
Pt aware of lab results & voiced understanding of those results.

## 2017-11-28 DIAGNOSIS — B359 Dermatophytosis, unspecified: Secondary | ICD-10-CM | POA: Diagnosis not present

## 2017-11-28 DIAGNOSIS — L821 Other seborrheic keratosis: Secondary | ICD-10-CM | POA: Diagnosis not present

## 2017-11-28 DIAGNOSIS — B353 Tinea pedis: Secondary | ICD-10-CM | POA: Diagnosis not present

## 2018-07-21 DIAGNOSIS — E669 Obesity, unspecified: Secondary | ICD-10-CM | POA: Insufficient documentation

## 2018-07-21 DIAGNOSIS — E663 Overweight: Secondary | ICD-10-CM

## 2018-07-21 NOTE — Progress Notes (Signed)
Complete Physical  Assessment and Plan:  Gaynell FaceMarshall was seen today for annual exam.  Diagnoses and all orders for this visit:  Encounter for routine adult health examination without abnormal findings  Elevated BP without diagnosis of hypertension Monitor blood pressure at home; call if consistently over 130/80 Discussed DASH diet.   Reminder to go to the ER if any CP, SOB, nausea, dizziness, severe HA, changes vision/speech, left arm numbness and tingling and jaw pain.  Hyperlipidemia, unspecified hyperlipidemia type If LDL remains 130+ will initiate statin Continue low cholesterol diet and exercise.  Check lipid panel. -     Lipid panel -     TSH  Obesity (BMI 30.0-34.9) Long discussion about weight loss, diet, and exercise Recommended diet heavy in fruits and veggies and low in animal meats, cheeses, and dairy products, appropriate calorie intake Discussed appropriate weight for height  Follow up at next visit -     Hemoglobin A1c -     Insulin, random  Medication management -     CBC with Differential/Platelet -     COMPLETE METABOLIC PANEL WITH GFR -     Magnesium -     Urinalysis w microscopic + reflex cultur  Screening for hematuria or proteinuria -     Urinalysis w microscopic + reflex cultur -     Microalbumin / creatinine urine ratio  Screening for diabetes mellitus -     Hemoglobin A1c -     Insulin, random  Vitamin D deficiency Discussed goal of 70, benefits Get on supplement, double up if forgetting to take -     VITAMIN D 25 Hydroxy (Vit-D Deficiency, Fractures)   Discussed med's effects and SE's. Screening labs and tests as requested with regular follow-up as recommended. Over 40 minutes of exam, counseling, chart review and critical decision making was performed  Future Appointments  Date Time Provider Department Center  07/30/2019 10:00 AM Judd Gaudierorbett, Mayetta Castleman, NP [facility] None    HPI  This very nice 36 y.o.male presents for complete physical. He  has Hyperlipidemia and Obesity (BMI 30.0-34.9) on their problem list. Patient reports no complaints at this time.   He is married, 1 child - boy, 38 years old and doing well. 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   BMI is Body mass index is 29.87 kg/m., he has been working on diet and exercise. He has been eating more veggies and back on modified keto diet.  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 He denies chest pain, shortness of breath, dizziness.   He is not on cholesterol medication and denies myalgias. His cholesterol is not at goal. The cholesterol last visit was:   Lab Results  Component Value Date   CHOL 212 (H) 07/17/2017   HDL 28 (L) 07/17/2017   LDLCALC 148 (H) 07/17/2017   TRIG 224 (H) 07/17/2017   CHOLHDL 7.6 (H) 07/17/2017   Last A1C in the office was:  Lab Results  Component Value Date   HGBA1C 5.1 07/17/2017   Patient is not consistently on Vitamin D supplement.   Lab Results  Component Value Date   VD25OH 33 07/17/2017  Current Medications:  Current Outpatient Medications on File Prior to Visit  Medication Sig Dispense Refill  . meloxicam (MOBIC) 15 MG tablet Take 1 tablet (15 mg total) by mouth daily. (Patient taking differently: Take 15 mg by mouth as needed. ) 90 tablet 0   No current facility-administered medications on file prior to visit.    Health Maintenance:   Immunization History  Administered Date(s) Administered  . Tdap 07/17/2017    TD/TDAP: 2018 Influenza: declines Pneumovax: -  HPV:   Vision: no issues, last remote Dental: Dr. Autumn Patty, last visit 2019, goes q69m Derm: Dr. ?, had full screen earlier this year  Allergies:  Allergies   Allergen Reactions  . Penicillins Hives, Shortness Of Breath and Swelling   Medical History:  has Hyperlipidemia and Obesity (BMI 30.0-34.9) on their problem list. Surgical History:  He  has a past surgical history that includes Skin graft. Family History:  Hisfamily history includes Hyperlipidemia in his father; Hypertension in his brother, father, and mother; Pancreatic cancer in his maternal grandfather and maternal grandmother. Social History:   reports that he has never smoked. He has never used smokeless tobacco. He reports that he drinks about 3.0 standard drinks of alcohol per week. He reports that he does not use drugs.  Review of Systems: Review of Systems  Constitutional: Negative.  Negative for malaise/fatigue and weight loss.  HENT: Negative.  Negative for hearing loss and tinnitus.   Eyes: Negative.  Negative for blurred vision and double vision.  Respiratory: Negative.  Negative for cough, sputum production, shortness of breath and wheezing.   Cardiovascular: Negative.  Negative for chest pain, palpitations, orthopnea, claudication, leg swelling and PND.  Gastrointestinal: Negative.  Negative for abdominal pain, blood in stool, constipation, diarrhea, heartburn, melena, nausea and vomiting.  Genitourinary: Negative.   Musculoskeletal: Negative.  Negative for falls, joint pain and myalgias.  Skin: Negative.  Negative for rash.  Neurological: Negative for dizziness, tingling, sensory change, weakness and headaches.  Endo/Heme/Allergies: Negative for polydipsia.  Psychiatric/Behavioral: Negative.  Negative for depression, memory loss, substance abuse and suicidal ideas. The patient is not nervous/anxious and does not have insomnia.   All other systems reviewed and are negative.   Physical Exam: Estimated body mass index is 29.87 kg/m as calculated from the following:   Height as of this encounter: 6\' 3"  (1.905 m).   Weight as of this encounter: 239 lb (108.4 kg). BP  136/80   Pulse 60   Temp (!) 97.3 F (36.3 C)   Ht 6\' 3"  (1.905 m)   Wt 239 lb (108.4 kg)   SpO2 99%   BMI 29.87 kg/m  General Appearance: Well nourished, in no apparent distress.  Eyes: PERRLA, EOMs, conjunctiva no swelling or erythema, normal fundi and vessels.  Sinuses: No Frontal/maxillary tenderness  ENT/Mouth: Ext aud canals clear, normal light reflex with TMs without erythema, bulging. Good dentition. No erythema, swelling, or exudate on post pharynx. Tonsils not swollen or erythematous. Hearing normal.  Neck: Supple, thyroid normal. No bruits  Respiratory: Respiratory effort normal, BS equal bilaterally without rales, rhonchi, wheezing or stridor.  Cardio: RRR without murmurs, rubs or gallops. Brisk peripheral pulses without edema.  Chest: symmetric, with normal excursions and percussion.  Abdomen: Soft, nontender, no guarding, rebound, hernias, masses, or organomegaly.  Lymphatics: Non tender without lymphadenopathy.  Genitourinary: defer Musculoskeletal: Full ROM all peripheral extremities,5/5 strength, and normal gait.  Skin: Warm, dry without rashes, lesions, ecchymosis. Neuro: Cranial nerves intact, reflexes equal bilaterally. Normal muscle tone,  no cerebellar symptoms. Sensation intact.  Psych: Awake and oriented X 3, normal affect, Insight and Judgment appropriate.   EKG: defer  Dan Maker 10:11 AM [facility]

## 2018-07-22 ENCOUNTER — Encounter: Payer: Self-pay | Admitting: Physician Assistant

## 2018-07-24 ENCOUNTER — Encounter: Payer: Self-pay | Admitting: Adult Health

## 2018-07-24 ENCOUNTER — Ambulatory Visit (INDEPENDENT_AMBULATORY_CARE_PROVIDER_SITE_OTHER): Payer: 59 | Admitting: Adult Health

## 2018-07-24 VITALS — BP 136/80 | HR 60 | Temp 97.3°F | Ht 75.0 in | Wt 239.0 lb

## 2018-07-24 DIAGNOSIS — Z0000 Encounter for general adult medical examination without abnormal findings: Secondary | ICD-10-CM

## 2018-07-24 DIAGNOSIS — Z1389 Encounter for screening for other disorder: Secondary | ICD-10-CM

## 2018-07-24 DIAGNOSIS — E785 Hyperlipidemia, unspecified: Secondary | ICD-10-CM | POA: Diagnosis not present

## 2018-07-24 DIAGNOSIS — Z79899 Other long term (current) drug therapy: Secondary | ICD-10-CM

## 2018-07-24 DIAGNOSIS — R030 Elevated blood-pressure reading, without diagnosis of hypertension: Secondary | ICD-10-CM

## 2018-07-24 DIAGNOSIS — Z131 Encounter for screening for diabetes mellitus: Secondary | ICD-10-CM

## 2018-07-24 DIAGNOSIS — E559 Vitamin D deficiency, unspecified: Secondary | ICD-10-CM

## 2018-07-24 DIAGNOSIS — E669 Obesity, unspecified: Secondary | ICD-10-CM

## 2018-07-24 NOTE — Patient Instructions (Signed)
Stuart Newman , Thank you for taking time to come for your Annual Wellness Visit. I appreciate your ongoing commitment to your health goals. Please review the following plan we discussed and let me know if I can assist you in the future.   These are the goals we discussed: Goals    . Blood Pressure < 120/80    . LDL CALC < 100    . Weight (lb) < 225 lb (102.1 kg)       This is a list of the screening recommended for you and due dates:  Health Maintenance  Topic Date Due  . Flu Shot  07/25/2019*  . HIV Screening  07/25/2019*  . Tetanus Vaccine  07/18/2027  *Topic was postponed. The date shown is not the original due date.    Recommend getting on vitamin D supplement - if forgetting to take, take higher dose/double up   HYPERTENSION INFORMATION  Monitor your blood pressure at home, please keep a record and bring that in with you to your next office visit.   Go to the ER if any CP, SOB, nausea, dizziness, severe HA, changes vision/speech  Due to a recent study, SPRINT, we have changed our goal for the systolic or top blood pressure number. Ideally we want your top number at 120.  In the [facility] Trial, 5000 people were randomized to a goal BP of 120 and 5000 people were randomized to a goal BP of less than 140. The patients with the goal BP at 120 had LESS DEMENTIA, LESS HEART ATTACKS, AND LESS STROKES, AS WELL AS OVERALL DECREASED MORTALITY OR DEATH RATE.   If you are willing, our goal BP is the top number of 120.  Your most recent BP: BP: 136/80   Take your medications faithfully as instructed. Maintain a healthy weight. Get at least 150 minutes of aerobic exercise per week. Minimize salt intake. Minimize alcohol intake  DASH Eating Plan DASH stands for "Dietary Approaches to Stop Hypertension." The DASH eating plan is a healthy eating plan that has been shown to reduce high blood pressure (hypertension). Additional health benefits may include reducing the risk of type 2 diabetes  mellitus, heart disease, and stroke. The DASH eating plan may also help with weight loss. WHAT DO I NEED TO KNOW ABOUT THE DASH EATING PLAN? For the DASH eating plan, you will follow these general guidelines:  Choose foods with a percent daily value for sodium of less than 5% (as listed on the food label).  Use salt-free seasonings or herbs instead of table salt or sea salt.  Check with your health care provider or pharmacist before using salt substitutes.  Eat lower-sodium products, often labeled as "lower sodium" or "no salt added."  Eat fresh foods.  Eat more vegetables, fruits, and low-fat dairy products.  Choose whole grains. Look for the word "whole" as the first word in the ingredient list.  Choose fish and skinless chicken or [place] more often than red meat. Limit fish, poultry, and meat to 6 oz (170 g) each day.  Limit sweets, desserts, sugars, and sugary drinks.  Choose heart-healthy fats.  Limit cheese to 1 oz (28 g) per day.  Eat more home-cooked food and less restaurant, buffet, and fast food.  Limit fried foods.  Cook foods using methods other than frying.  Limit canned vegetables. If you do use them, rinse them well to decrease the sodium.  When eating at a restaurant, ask that your food be prepared with less salt,  or no salt if possible. WHAT FOODS CAN I EAT? Seek help from a dietitian for individual calorie needs. Grains Whole grain or whole wheat bread. Brown rice. Whole grain or whole wheat pasta. Quinoa, bulgur, and whole grain cereals. Low-sodium cereals. Corn or whole wheat flour tortillas. Whole grain cornbread. Whole grain crackers. Low-sodium crackers. Vegetables Fresh or frozen vegetables (raw, steamed, roasted, or grilled). Low-sodium or reduced-sodium tomato and vegetable juices. Low-sodium or reduced-sodium tomato sauce and paste. Low-sodium or reduced-sodium canned vegetables.  Fruits All fresh, canned (in natural juice), or frozen fruits. Meat  and Other Protein Products Ground beef (85% or leaner), grass-fed beef, or beef trimmed of fat. Skinless chicken or [place]. Ground chicken or [place]. Pork trimmed of fat. All fish and seafood. Eggs. Dried beans, peas, or lentils. Unsalted nuts and seeds. Unsalted canned beans. Dairy Low-fat dairy products, such as skim or 1% milk, 2% or reduced-fat cheeses, low-fat ricotta or cottage cheese, or plain low-fat yogurt. Low-sodium or reduced-sodium cheeses. Fats and Oils Tub margarines without trans fats. Light or reduced-fat mayonnaise and salad dressings (reduced sodium). Avocado. Safflower, olive, or canola oils. Natural peanut or almond butter. Other Unsalted popcorn and pretzels. The items listed above may not be a complete list of recommended foods or beverages. Contact your dietitian for more options. WHAT FOODS ARE NOT RECOMMENDED? Grains White bread. White pasta. White rice. Refined cornbread. Bagels and croissants. Crackers that contain trans fat. Vegetables Creamed or fried vegetables. Vegetables in a cheese sauce. Regular canned vegetables. Regular canned tomato sauce and paste. Regular tomato and vegetable juices. Fruits Dried fruits. Canned fruit in light or heavy syrup. Fruit juice. Meat and Other Protein Products Fatty cuts of meat. Ribs, chicken wings, bacon, sausage, bologna, salami, chitterlings, fatback, hot dogs, bratwurst, and packaged luncheon meats. Salted nuts and seeds. Canned beans with salt. Dairy Whole or 2% milk, cream, half-and-half, and cream cheese. Whole-fat or sweetened yogurt. Full-fat cheeses or blue cheese. Nondairy creamers and whipped toppings. Processed cheese, cheese spreads, or cheese curds. Condiments Onion and garlic salt, seasoned salt, table salt, and sea salt. Canned and packaged gravies. Worcestershire sauce. Tartar sauce. Barbecue sauce. Teriyaki sauce. Soy sauce, including reduced sodium. Steak sauce. Fish sauce. Oyster sauce. Cocktail sauce.  Horseradish. Ketchup and mustard. Meat flavorings and tenderizers. Bouillon cubes. Hot sauce. Tabasco sauce. Marinades. Taco seasonings. Relishes. Fats and Oils Butter, stick margarine, lard, shortening, ghee, and bacon fat. Coconut, palm kernel, or palm oils. Regular salad dressings. Other Pickles and olives. Salted popcorn and pretzels. The items listed above may not be a complete list of foods and beverages to avoid. Contact your dietitian for more information. WHERE CAN I FIND MORE INFORMATION? National Heart, Lung, and Blood Institute: [employer].it Document Released: 10/05/2011 Document Revised: 03/02/2014 Document Reviewed: 08/20/2013 [employer] Patient Information 2015 [place], [place]. This information is not intended to replace advice given to you by your health care provider. Make sure you discuss any questions you have with your health care provider.     Preventing High Cholesterol Cholesterol is a waxy, fat-like substance that your body needs in small amounts. Your liver makes all the cholesterol that your body needs. Having high cholesterol (hypercholesterolemia) increases your risk for heart disease and stroke. Extra (excess) cholesterol comes from the food you eat, such as animal-based fat (saturated fat) from meat and some dairy products. High cholesterol can often be prevented with diet and lifestyle changes. If you already have high cholesterol, you can control it with diet and lifestyle changes, as  well as medicine. What nutrition changes can be made?  Eat less saturated fat. Foods that contain saturated fat include red meat and some dairy products.  Avoid processed meats, like bacon and lunch meats.  Avoid trans fats, which are found in margarine and some baked goods.  Avoid foods and beverages that have added sugars.  Eat more fruits, vegetables, and whole grains.  Choose healthy sources of protein, such as fish, poultry, and  nuts.  Choose healthy sources of fat, such as: ? Nuts. ? Vegetable oils, especially olive oil. ? Fish that have healthy fats (omega-3 fatty acids), such as mackerel or salmon. What lifestyle changes can be made?  Lose weight if you are overweight. Losing 5-10 lb (2.3-4.5 kg) can help prevent or control high cholesterol and reduce your risk for diabetes and high blood pressure. Ask your health care provider to help you with a diet and exercise plan to safely lose weight.  Get enough exercise. Do at least 150 minutes of moderate-intensity exercise each week. ? You could do this in short exercise sessions several times a day, or you could do longer exercise sessions a few times a week. For example, you could take a brisk 10-minute walk or bike ride, 3 times a day, for 5 days a week.  Do not smoke. If you need help quitting, ask your health care provider.  Limit your alcohol intake. If you drink alcohol, limit alcohol intake to no more than 1 drink a day for nonpregnant women and 2 drinks a day for men. One drink equals 12 oz of beer, 5 oz of wine, or 1 oz of hard liquor. Why are these changes important? If you have high cholesterol, deposits (plaques) may build up on the walls of your blood vessels. Plaques make the arteries narrower and stiffer, which can restrict or block blood flow and cause blood clots to form. This greatly increases your risk for heart attack and stroke. Making diet and lifestyle changes can reduce your risk for these life-threatening conditions. What can I do to lower my risk?  Manage your risk factors for high cholesterol. Talk with your health care provider about all of your risk factors and how to lower your risk.  Manage other conditions that you have, such as diabetes or high blood pressure (hypertension).  Have your cholesterol checked at regular intervals.  Keep all follow-up visits as told by your health care provider. This is important. How is this  treated? In addition to diet and lifestyle changes, your health care provider may recommend medicines to help lower cholesterol, such as a medicine to reduce the amount of cholesterol made in your liver. You may need medicine if:  Diet and lifestyle changes do not lower your cholesterol enough.  You have high cholesterol and other risk factors for heart disease or stroke.  Take over-the-counter and prescription medicines only as told by your health care provider. Where to find more information:  American Heart Association: 1122334455.jsp  National Heart, Lung, and Blood Institute: http://hood.com/ Summary  High cholesterol increases your risk for heart disease and stroke. By keeping your cholesterol level low, you can reduce your risk for these conditions.  Diet and lifestyle changes are the most important steps in preventing high cholesterol.  Work with your health care provider to manage your risk factors, and have your blood tested regularly. This information is not intended to replace advice given to you by your health care provider. Make sure you discuss any questions you have  with your health care provider. Document Released: 10/31/2015 Document Revised: 06/24/2016 Document Reviewed: 06/24/2016 Elsevier Interactive Patient Education  2018 [employer].    Atorvastatin tablets What is this medicine? ATORVASTATIN (a TORE va sta tin) is known as a HMG-CoA reductase inhibitor or 'statin'. It lowers the level of cholesterol and triglycerides in the blood. This drug may also reduce the risk of heart attack, stroke, or other health problems in patients with risk factors for heart disease. Diet and lifestyle changes are often used with this drug. This medicine may be used for other purposes; ask your health care provider or pharmacist if you have questions. COMMON  BRAND NAME(S): Lipitor What should I tell my health care provider before I take this medicine? They need to know if you have any of these conditions: -frequently drink alcoholic beverages -history of stroke, TIA -kidney disease -liver disease -muscle aches or weakness -other medical condition -an unusual or allergic reaction to atorvastatin, other medicines, foods, dyes, or preservatives -pregnant or trying to get pregnant -breast-feeding How should I use this medicine? Take this medicine by mouth with a glass of water. Follow the directions on the prescription label. You can take this medicine with or without food. Take your doses at regular intervals. Do not take your medicine more often than directed. Talk to your pediatrician regarding the use of this medicine in children. While this drug may be prescribed for children as young as 29 years old for selected conditions, precautions do apply. Overdosage: If you think you have taken too much of this medicine contact a poison control center or emergency room at once. NOTE: This medicine is only for you. Do not share this medicine with others. What if I miss a dose? If you miss a dose, take it as soon as you can. If it is almost time for your next dose, take only that dose. Do not take double or extra doses. What may interact with this medicine? Do not take this medicine with any of the following medications: -red yeast rice -telaprevir -telithromycin -voriconazole This medicine may also interact with the following medications: -alcohol -antiviral medicines for HIV or AIDS -boceprevir -certain antibiotics like clarithromycin, erythromycin, troleandomycin -certain medicines for cholesterol like fenofibrate or gemfibrozil -cimetidine -clarithromycin -colchicine -cyclosporine -digoxin -male hormones, like estrogens or progestins and birth control pills -grapefruit juice -medicines for fungal infections like fluconazole,  itraconazole, ketoconazole -niacin -rifampin -spironolactone This list may not describe all possible interactions. Give your health care provider a list of all the medicines, herbs, non-prescription drugs, or dietary supplements you use. Also tell them if you smoke, drink alcohol, or use illegal drugs. Some items may interact with your medicine. What should I watch for while using this medicine? Visit your doctor or health care professional for regular check-ups. You may need regular tests to make sure your liver is working properly. Tell your doctor or health care professional right away if you get any unexplained muscle pain, tenderness, or weakness, especially if you also have a fever and tiredness. Your doctor or health care professional may tell you to stop taking this medicine if you develop muscle problems. If your muscle problems do not go away after stopping this medicine, contact your health care professional. This drug is only part of a total heart-health program. Your doctor or a dietician can suggest a low-cholesterol and low-fat diet to help. Avoid alcohol and smoking, and keep a proper exercise schedule. Do not use this drug if you are pregnant or breast-feeding.  Serious side effects to an unborn child or to an infant are possible. Talk to your doctor or pharmacist for more information. This medicine may affect blood sugar levels. If you have diabetes, check with your doctor or health care professional before you change your diet or the dose of your diabetic medicine. If you are going to have surgery tell your health care professional that you are taking this drug. What side effects may I notice from receiving this medicine? Side effects that you should report to your doctor or health care professional as soon as possible: -allergic reactions like skin rash, itching or hives, swelling of the face, lips, or tongue -dark urine -fever -joint pain -muscle cramps, pain -redness,  blistering, peeling or loosening of the skin, including inside the mouth -trouble passing urine or change in the amount of urine -unusually weak or tired -yellowing of eyes or skin Side effects that usually do not require medical attention (report to your doctor or health care professional if they continue or are bothersome): -constipation -heartburn -stomach gas, pain, upset This list may not describe all possible side effects. Call your doctor for medical advice about side effects. You may report side effects to FDA at 1-800-FDA-1088. Where should I keep my medicine? Keep out of the reach of children. Store at room temperature between 20 to 25 degrees C (68 to 77 degrees F). Throw away any unused medicine after the expiration date. NOTE: This sheet is a summary. It may not cover all possible information. If you have questions about this medicine, talk to your doctor, pharmacist, or health care provider.  2018 Elsevier/Gold Standard (2011-09-05 16:10:96)

## 2018-07-25 LAB — LIPID PANEL
CHOLESTEROL: 213 mg/dL — AB (ref <200)
HDL: 40 mg/dL — AB (ref >40)
LDL CHOLESTEROL (CALC): 142 mg/dL — AB
Non-HDL Cholesterol (Calc): 173 mg/dL (calc) — ABNORMAL HIGH (ref <130)
TRIGLYCERIDES: 173 mg/dL — AB (ref <150)
Total CHOL/HDL Ratio: 5.3 (calc) — ABNORMAL HIGH (ref <5.0)

## 2018-07-25 LAB — TSH: TSH: 1.05 m[IU]/L (ref 0.40–4.50)

## 2018-07-25 LAB — CBC WITH DIFFERENTIAL/PLATELET
BASOS PCT: 1 %
Basophils Absolute: 72 cells/uL (ref 0–200)
EOS PCT: 3.1 %
Eosinophils Absolute: 223 cells/uL (ref 15–500)
HEMATOCRIT: 45.3 % (ref 38.5–50.0)
HEMOGLOBIN: 15.1 g/dL (ref 13.2–17.1)
LYMPHS ABS: 2930 {cells}/uL (ref 850–3900)
MCH: 30 pg (ref 27.0–33.0)
MCHC: 33.3 g/dL (ref 32.0–36.0)
MCV: 89.9 fL (ref 80.0–100.0)
MPV: 10.5 fL (ref 7.5–12.5)
Monocytes Relative: 10 %
NEUTROS ABS: 3254 {cells}/uL (ref 1500–7800)
Neutrophils Relative %: 45.2 %
Platelets: 277 10*3/uL (ref 140–400)
RBC: 5.04 10*6/uL (ref 4.20–5.80)
RDW: 13.1 % (ref 11.0–15.0)
Total Lymphocyte: 40.7 %
WBC: 7.2 10*3/uL (ref 3.8–10.8)
WBCMIX: 720 {cells}/uL (ref 200–950)

## 2018-07-25 LAB — HEMOGLOBIN A1C
EAG (MMOL/L): 6.2 (calc)
Hgb A1c MFr Bld: 5.5 % of total Hgb (ref <5.7)
MEAN PLASMA GLUCOSE: 111 (calc)

## 2018-07-25 LAB — COMPLETE METABOLIC PANEL WITH GFR
AG Ratio: 1.7 (calc) (ref 1.0–2.5)
ALKALINE PHOSPHATASE (APISO): 82 U/L (ref 40–115)
ALT: 19 U/L (ref 9–46)
AST: 17 U/L (ref 10–40)
Albumin: 4.9 g/dL (ref 3.6–5.1)
BUN: 18 mg/dL (ref 7–25)
CALCIUM: 10.1 mg/dL (ref 8.6–10.3)
CO2: 27 mmol/L (ref 20–32)
CREATININE: 0.97 mg/dL (ref 0.60–1.35)
Chloride: 104 mmol/L (ref 98–110)
GFR, EST NON AFRICAN AMERICAN: 99 mL/min/{1.73_m2} (ref >=60)
GFR, Est African American: 115 mL/min/{1.73_m2} (ref >=60)
GLUCOSE: 88 mg/dL (ref 65–99)
Globulin: 2.9 g/dL (calc) (ref 1.9–3.7)
Potassium: 4.3 mmol/L (ref 3.5–5.3)
Sodium: 138 mmol/L (ref 135–146)
Total Bilirubin: 0.6 mg/dL (ref 0.2–1.2)
Total Protein: 7.8 g/dL (ref 6.1–8.1)

## 2018-07-25 LAB — MAGNESIUM: MAGNESIUM: 2.2 mg/dL (ref 1.5–2.5)

## 2018-07-25 LAB — URINALYSIS W MICROSCOPIC + REFLEX CULTURE
BACTERIA UA: NONE SEEN /HPF
Bilirubin Urine: NEGATIVE
Glucose, UA: NEGATIVE
Hgb urine dipstick: NEGATIVE
Hyaline Cast: NONE SEEN /LPF
Ketones, ur: NEGATIVE
Leukocyte Esterase: NEGATIVE
Nitrites, Initial: NEGATIVE
PH: 6 (ref 5.0–8.0)
Protein, ur: NEGATIVE
RBC / HPF: NONE SEEN /HPF (ref 0–2)
SPECIFIC GRAVITY, URINE: 1.01 (ref 1.001–1.03)
SQUAMOUS EPITHELIAL / LPF: NONE SEEN /HPF (ref <=5)
WBC, UA: NONE SEEN /HPF (ref 0–5)

## 2018-07-25 LAB — MICROALBUMIN / CREATININE URINE RATIO: CREATININE, URINE: 18 mg/dL — AB (ref 20–320)

## 2018-07-25 LAB — INSULIN, RANDOM: INSULIN: 4.3 u[IU]/mL (ref 2.0–19.6)

## 2018-07-25 LAB — NO CULTURE INDICATED

## 2018-07-25 LAB — VITAMIN D 25 HYDROXY (VIT D DEFICIENCY, FRACTURES): VIT D 25 HYDROXY: 42 ng/mL (ref 30–100)

## 2019-02-05 NOTE — Progress Notes (Signed)
FOLLOW UP  Assessment and Plan:   History of elevated BP without dx of htn Well controlled at this time without medications Monitor blood pressure at home; patient to call if consistently greater than 130/80 Continue DASH diet.   Reminder to go to the ER if any CP, SOB, nausea, dizziness, severe HA, changes vision/speech, left arm numbness and tingling and jaw pain.  Cholesterol Currently above goal; working on lifestyle Severely elevated at work physical labs; initiating rosuvastatin 5 mg three times weekly today after discussion of risks and benefits; f/u 12 weeks  Continue low cholesterol diet and exercise.  Check lipid panel.   Overweight Long discussion about weight loss, diet, and exercise Recommended diet heavy in fruits and veggies and low in animal meats, cheeses, and dairy products, appropriate calorie intake Discussed ideal weight for height  and initial weight goal (<225 lb) Will follow up in 3 months  Vitamin D Def Below goal at last visit;  continue supplementation to maintain goal of 60-100 Restart vitamin D supplement Defer Vit D level to CPE  Continue diet and meds as discussed. Further disposition pending results of labs. Discussed med's effects and SE's.   Over 30 minutes of exam, counseling, chart review, and critical decision making was performed.   Future Appointments  Date Time Provider Department Center  07/30/2019 10:00 AM Judd Gaudierorbett, Sigismund Cross, NP [facility] None    ----------------------------------------------------------------------------------------------------------------------  HPI 38 y.o. male  presents for 3 month follow up on history of elevated BP, cholesterol, weight and vitamin D deficiency.   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   BMI is Body mass index is 32.25 kg/m., he has not been working on diet, reports was doing good through October, then went on cruises and the holidays.  Wt Readings from Last 3 Encounters:   02/06/19 258 lb (117 kg)  07/24/18 239 lb (108.4 kg)  07/17/17 248 lb 6.4 oz (112.7 kg)   His blood pressure has been controlled at home, today their BP is BP: 120/72  He does workout. He denies chest pain, shortness of breath, dizziness.   He is not on cholesterol medication. His cholesterol is not at goal. The cholesterol last visit was:   Lab Results  Component Value Date   CHOL 213 (H) 07/24/2018   HDL 40 (L) 07/24/2018   LDLCALC 142 (H) 07/24/2018   TRIG 173 (H) 07/24/2018   CHOLHDL 5.3 (H) 07/24/2018   Patient is on Vitamin D supplement.   Lab Results  Component Value Date   VD25OH 42 07/24/2018        Current Medications:  Current Outpatient Medications on File Prior to Visit  Medication Sig  . meloxicam (MOBIC) 15 MG tablet Take 1 tablet (15 mg total) by mouth daily. (Patient taking differently: Take 15 mg by mouth as needed. )   No current facility-administered medications on file prior to visit.      Allergies:  Allergies  Allergen Reactions  . Penicillins Hives, Shortness Of Breath and Swelling     Medical History:  Past Medical History:  Diagnosis Date  . Hyperlipidemia 07/18/2017   Family history- Reviewed and unchanged Social history- Reviewed and unchanged   Review of Systems:  Review of Systems  Constitutional: Negative for malaise/fatigue and weight loss.  HENT: Negative for hearing loss and tinnitus.   Eyes: Negative for blurred vision and double vision.  Respiratory: Negative for cough, shortness of breath and wheezing.   Cardiovascular: Negative for chest pain, palpitations, orthopnea,  claudication and leg swelling.  Gastrointestinal: Negative for abdominal pain, blood in stool, constipation, diarrhea, heartburn, melena, nausea and vomiting.  Genitourinary: Negative.   Musculoskeletal: Negative for joint pain and myalgias.  Skin: Negative for rash.  Neurological: Negative for dizziness, tingling, sensory change, weakness and headaches.   Endo/Heme/Allergies: Negative for polydipsia.  Psychiatric/Behavioral: Negative.   All other systems reviewed and are negative.     Physical Exam: BP 120/72   Pulse 84   Temp 97.7 F (36.5 C)   Ht 6\' 3"  (1.905 m)   Wt 258 lb (117 kg)   SpO2 99%   BMI 32.25 kg/m  Wt Readings from Last 3 Encounters:  02/06/19 258 lb (117 kg)  07/24/18 239 lb (108.4 kg)  07/17/17 248 lb 6.4 oz (112.7 kg)   General Appearance: Well nourished, in no apparent distress. Eyes: PERRLA, EOMs, conjunctiva no swelling or erythema Sinuses: No Frontal/maxillary tenderness ENT/Mouth: Ext aud canals clear, TMs without erythema, bulging. No erythema, swelling, or exudate on post pharynx.  Tonsils not swollen or erythematous. Hearing normal.  Neck: Supple, thyroid normal.  Respiratory: Respiratory effort normal, BS equal bilaterally without rales, rhonchi, wheezing or stridor.  Cardio: RRR with no MRGs. Brisk peripheral pulses without edema.  Abdomen: Soft, + BS.  Non tender, no guarding, rebound, hernias, masses. Lymphatics: Non tender without lymphadenopathy.  Musculoskeletal: Full ROM, 5/5 strength, Normal gait Skin: Warm, dry without rashes, lesions, ecchymosis.  Neuro: Cranial nerves intact. No cerebellar symptoms.  Psych: Awake and oriented X 3, normal affect, Insight and Judgment appropriate.    Dan Maker, NP 9:02 AM [facility]

## 2019-02-06 ENCOUNTER — Ambulatory Visit: Payer: 59 | Admitting: Adult Health

## 2019-02-06 ENCOUNTER — Other Ambulatory Visit: Payer: Self-pay

## 2019-02-06 ENCOUNTER — Encounter: Payer: Self-pay | Admitting: Adult Health

## 2019-02-06 VITALS — BP 120/72 | HR 84 | Temp 97.7°F | Ht 75.0 in | Wt 258.0 lb

## 2019-02-06 DIAGNOSIS — R030 Elevated blood-pressure reading, without diagnosis of hypertension: Secondary | ICD-10-CM

## 2019-02-06 DIAGNOSIS — Z79899 Other long term (current) drug therapy: Secondary | ICD-10-CM

## 2019-02-06 DIAGNOSIS — E663 Overweight: Secondary | ICD-10-CM | POA: Diagnosis not present

## 2019-02-06 DIAGNOSIS — E559 Vitamin D deficiency, unspecified: Secondary | ICD-10-CM | POA: Diagnosis not present

## 2019-02-06 DIAGNOSIS — E785 Hyperlipidemia, unspecified: Secondary | ICD-10-CM

## 2019-02-06 MED ORDER — ROSUVASTATIN CALCIUM 5 MG PO TABS: ORAL_TABLET | ORAL | 1 refills | Status: DC

## 2019-02-06 NOTE — Patient Instructions (Signed)
Goals    . Blood Pressure < 120/80    . LDL CALC < 100    . Weight (lb) < 225 lb (102.1 kg)       Start rosuvastatin 5 mg once weekly (F), then next week do M/F, then increase to MWF in the evening if tolerating without side effects.   Focus on minimally processed whole foods diet, focus on lean proteins and plant based high fiber items and healthy fats (nuts, olives/olive oil, seeds, avocado)   Rosuvastatin Tablets What is this medicine? ROSUVASTATIN (roe SOO va sta tin) is known as a HMG-CoA reductase inhibitor or 'statin'. It lowers cholesterol and triglycerides in the blood. This drug may also reduce the risk of heart attack, stroke, or other health problems in patients with risk factors for heart disease. Diet and lifestyle changes are often used with this drug. This medicine may be used for other purposes; ask your health care provider or pharmacist if you have questions. COMMON BRAND NAME(S): Crestor What should I tell my health care provider before I take this medicine? They need to know if you have any of these conditions: -diabetes -if you often drink alcohol -history of stroke -kidney disease -liver disease -muscle aches or weakness -thyroid disease -an unusual or allergic reaction to rosuvastatin, other medicines, foods, dyes, or preservatives -pregnant or trying to get pregnant -breast-feeding How should I use this medicine? Take this medicine by mouth with a glass of water. Follow the directions on the prescription label. Do not cut, crush or chew this medicine. You can take this medicine with or without food. Take your doses at regular intervals. Do not take your medicine more often than directed. Talk to your pediatrician regarding the use of this medicine in children. While this drug may be prescribed for children as young as 57 years old for selected conditions, precautions do apply. Overdosage: If you think you have taken too much of this medicine contact a poison  control center or emergency room at once. NOTE: This medicine is only for you. Do not share this medicine with others. What if I miss a dose? If you miss a dose, take it as soon as you can. If your next dose is to be taken in less than 12 hours, then do not take the missed dose. Take the next dose at your regular time. Do not take double or extra doses. What may interact with this medicine? Do not take this medicine with any of the following medications: -herbal medicines like red yeast rice This medicine may also interact with the following medications: -alcohol -antacids containing aluminum hydroxide or magnesium hydroxide -cyclosporine -other medicines for high cholesterol -some medicines for HIV infection -warfarin This list may not describe all possible interactions. Give your health care provider a list of all the medicines, herbs, non-prescription drugs, or dietary supplements you use. Also tell them if you smoke, drink alcohol, or use illegal drugs. Some items may interact with your medicine. What should I watch for while using this medicine? Visit your doctor or health care professional for regular check-ups. You may need regular tests to make sure your liver is working properly. Your health care professional may tell you to stop taking this medicine if you develop muscle problems. If your muscle problems do not go away after stopping this medicine, contact your health care professional. Do not become pregnant while taking this medicine. Women should inform their health care professional if they wish to become pregnant or think they  might be pregnant. There is a potential for serious side effects to an unborn child. Talk to your health care professional or pharmacist for more information. Do not breast-feed an infant while taking this medicine. This medicine may affect blood sugar levels. If you have diabetes, check with your doctor or health care professional before you change your diet  or the dose of your diabetic medicine. If you are going to need surgery or other procedure, tell your doctor that you are using this medicine. This drug is only part of a total heart-health program. Your doctor or a dietician can suggest a low-cholesterol and low-fat diet to help. Avoid alcohol and smoking, and keep a proper exercise schedule. This medicine may cause a decrease in Co-Enzyme Q-10. You should make sure that you get enough Co-Enzyme Q-10 while you are taking this medicine. Discuss the foods you eat and the vitamins you take with your health care professional. What side effects may I notice from receiving this medicine? Side effects that you should report to your doctor or health care professional as soon as possible: -allergic reactions like skin rash, itching or hives, swelling of the face, lips, or tongue -dark urine -fever -joint pain -muscle cramps, pain -redness, blistering, peeling or loosening of the skin, including inside the mouth -trouble passing urine or change in the amount of urine -unusually weak or tired -yellowing of the eyes or skin Side effects that usually do not require medical attention (report to your doctor or health care professional if they continue or are bothersome): -constipation -heartburn -nausea -stomach gas, pain, upset This list may not describe all possible side effects. Call your doctor for medical advice about side effects. You may report side effects to FDA at 1-800-FDA-1088. Where should I keep my medicine? Keep out of the reach of children. Store at room temperature between 20 and 25 degrees C (68 and 77 degrees F). Keep container tightly closed (protect from moisture). Throw away any unused medicine after the expiration date. NOTE: This sheet is a summary. It may not cover all possible information. If you have questions about this medicine, talk to your doctor, pharmacist, or health care provider.  2019 Elsevier/Gold Standard (2017-06-19  12:42:43)     Preventing High Cholesterol Cholesterol is a waxy, fat-like substance that your body needs in small amounts. Your liver makes all the cholesterol that your body needs. Having high cholesterol (hypercholesterolemia) increases your risk for heart disease and stroke. Extra (excess) cholesterol comes from the food you eat, such as animal-based fat (saturated fat) from meat and some dairy products. High cholesterol can often be prevented with diet and lifestyle changes. If you already have high cholesterol, you can control it with diet and lifestyle changes, as well as medicine. What nutrition changes can be made?  Eat less saturated fat. Foods that contain saturated fat include red meat and some dairy products.  Avoid processed meats, like bacon and lunch meats.  Avoid trans fats, which are found in margarine and some baked goods.  Avoid foods and beverages that have added sugars.  Eat more fruits, vegetables, and whole grains.  Choose healthy sources of protein, such as fish, poultry, and nuts.  Choose healthy sources of fat, such as: ? Nuts. ? Vegetable oils, especially olive oil. ? Fish that have healthy fats (omega-3 fatty acids), such as mackerel or salmon. What lifestyle changes can be made?   Lose weight if you are overweight. Losing 5-10 lb (2.3-4.5 kg) can help prevent or control  high cholesterol and reduce your risk for diabetes and high blood pressure. Ask your health care provider to help you with a diet and exercise plan to safely lose weight.  Get enough exercise. Do at least 150 minutes of moderate-intensity exercise each week. ? You could do this in short exercise sessions several times a day, or you could do longer exercise sessions a few times a week. For example, you could take a brisk 10-minute walk or bike ride, 3 times a day, for 5 days a week.  Do not smoke. If you need help quitting, ask your health care provider.  Limit your alcohol intake. If  you drink alcohol, limit alcohol intake to no more than 1 drink a day for nonpregnant women and 2 drinks a day for men. One drink equals 12 oz of beer, 5 oz of wine, or 1 oz of hard liquor. Why are these changes important?  If you have high cholesterol, deposits (plaques) may build up on the walls of your blood vessels. Plaques make the arteries narrower and stiffer, which can restrict or block blood flow and cause blood clots to form. This greatly increases your risk for heart attack and stroke. Making diet and lifestyle changes can reduce your risk for these life-threatening conditions. What can I do to lower my risk?  Manage your risk factors for high cholesterol. Talk with your health care provider about all of your risk factors and how to lower your risk.  Manage other conditions that you have, such as diabetes or high blood pressure (hypertension).  Have your cholesterol checked at regular intervals.  Keep all follow-up visits as told by your health care provider. This is important. How is this treated? In addition to diet and lifestyle changes, your health care provider may recommend medicines to help lower cholesterol, such as a medicine to reduce the amount of cholesterol made in your liver. You may need medicine if:  Diet and lifestyle changes do not lower your cholesterol enough.  You have high cholesterol and other risk factors for heart disease or stroke. Take over-the-counter and prescription medicines only as told by your health care provider. Where to find more information  American Heart Association: 1122334455www.heart.org/HEARTORG/Conditions/Cholesterol/Cholesterol_UCM_001089_SubHomePage.jsp  National Heart, Lung, and Blood Institute: http://hood.com/www.nhlbi.nih.gov/health/resources/heart/heart-cholesterol-hbc-what-html Summary  High cholesterol increases your risk for heart disease and stroke. By keeping your cholesterol level low, you can reduce your risk for these conditions.  Diet and  lifestyle changes are the most important steps in preventing high cholesterol.  Work with your health care provider to manage your risk factors, and have your blood tested regularly. This information is not intended to replace advice given to you by your health care provider. Make sure you discuss any questions you have with your health care provider. Document Released: 10/31/2015 Document Revised: 06/24/2016 Document Reviewed: 06/24/2016 Elsevier Interactive Patient Education  2019 [employer]

## 2019-02-07 LAB — LIPID PANEL
Cholesterol: 245 mg/dL — ABNORMAL HIGH (ref <200)
HDL: 38 mg/dL — ABNORMAL LOW (ref >=40)
LDL Cholesterol (Calc): 175 mg/dL (calc) — ABNORMAL HIGH
Non-HDL Cholesterol (Calc): 207 mg/dL (calc) — ABNORMAL HIGH (ref <130)
Total CHOL/HDL Ratio: 6.4 (calc) — ABNORMAL HIGH (ref <5.0)
Triglycerides: 168 mg/dL — ABNORMAL HIGH (ref <150)

## 2019-02-07 LAB — COMPLETE METABOLIC PANEL WITH GFR
AG Ratio: 1.7 (calc) (ref 1.0–2.5)
ALT: 17 U/L (ref 9–46)
AST: 16 U/L (ref 10–40)
Albumin: 4.6 g/dL (ref 3.6–5.1)
Alkaline phosphatase (APISO): 78 U/L (ref 36–130)
BUN: 25 mg/dL (ref 7–25)
CO2: 24 mmol/L (ref 20–32)
Calcium: 9.9 mg/dL (ref 8.6–10.3)
Chloride: 108 mmol/L (ref 98–110)
Creat: 0.9 mg/dL (ref 0.60–1.35)
GFR, Est African American: 125 mL/min/{1.73_m2} (ref >=60)
GFR, Est Non African American: 108 mL/min/{1.73_m2} (ref >=60)
Globulin: 2.7 g/dL (calc) (ref 1.9–3.7)
Glucose, Bld: 94 mg/dL (ref 65–99)
Potassium: 4.4 mmol/L (ref 3.5–5.3)
Sodium: 139 mmol/L (ref 135–146)
Total Bilirubin: 0.5 mg/dL (ref 0.2–1.2)
Total Protein: 7.3 g/dL (ref 6.1–8.1)

## 2019-02-25 ENCOUNTER — Encounter: Payer: Self-pay | Admitting: Internal Medicine

## 2019-05-07 NOTE — Progress Notes (Signed)
FOLLOW UP  Assessment and Plan:   History of elevated BP without dx of htn Well controlled at this time without medications Monitor blood pressure at home; patient to call if consistently greater than 130/80 Continue DASH diet.   Reminder to go to the ER if any CP, SOB, nausea, dizziness, severe HA, changes vision/speech, left arm numbness and tingling and jaw pain.  Cholesterol Currently above goal;  he is tolerating new rosuvastatin 5 mg three days a week; increase to taking daily  Continue low cholesterol diet and exercise.  Check lipid panel.   Overweight Long discussion about weight loss, diet, and exercise Recommended diet heavy in fruits and veggies and low in animal meats, cheeses, and dairy products, appropriate calorie intake Discussed ideal weight for height and initial weight goal (<245 lb) Cut down on liquid calories, focus on lean protein, fibrous veggies, try to limit cheese, etc that are concerning with keto type diets Will follow up in 3 months  Vitamin D Def Below goal at last visit;  continue supplementation to maintain goal of 60-100 Restart vitamin D supplement Defer Vit D level to CPE  Continue diet and meds as discussed. Further disposition pending results of labs. Discussed med's effects and SE's.   Over 30 minutes of exam, counseling, chart review, and critical decision making was performed.   Future Appointments  Date Time Provider Department Center  07/30/2019 10:00 AM Judd Gaudierorbett, Ebbie Sorenson, NP [facility] None    ----------------------------------------------------------------------------------------------------------------------  HPI 38 y.o. male  presents for 3 month follow up on history of elevated BP, cholesterol, weight and vitamin D deficiency.   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   BMI is Body mass index is 32 kg/m., he has been watching what he eats but not making significant changes, is working on cutting down on soft drink  intake, taking a gallon of water with him to work, works a physically intense job. He tries to watch carb intake though struggles due to food provided at work.  Wt Readings from Last 3 Encounters:  05/08/19 256 lb (116.1 kg)  02/06/19 258 lb (117 kg)  07/24/18 239 lb (108.4 kg)   His blood pressure has been controlled at home, today their BP is BP: 124/76  He does workout. He denies chest pain, shortness of breath, dizziness.   He is on cholesterol medication (newly initiated at last visit on rosuvastatin 5 mg three days weekly- has been out for the last week). His cholesterol is not at goal. The cholesterol last visit was:   Lab Results  Component Value Date   CHOL 245 (H) 02/06/2019   HDL 38 (L) 02/06/2019   LDLCALC 175 (H) 02/06/2019   TRIG 168 (H) 02/06/2019   CHOLHDL 6.4 (H) 02/06/2019   Patient is not consistently on Vitamin D supplement.   Lab Results  Component Value Date   VD25OH 42 07/24/2018       Current Medications:  Current Outpatient Medications on File Prior to Visit  Medication Sig  . meloxicam (MOBIC) 15 MG tablet Take 1 tablet (15 mg total) by mouth daily. (Patient taking differently: Take 15 mg by mouth as needed. )   No current facility-administered medications on file prior to visit.      Allergies:  Allergies  Allergen Reactions  . Penicillins Hives, Shortness Of Breath and Swelling     Medical History:  Past Medical History:  Diagnosis Date  . Hyperlipidemia 07/18/2017   Family history- Reviewed and unchanged Social  history- Reviewed and unchanged   Review of Systems:  Review of Systems  Constitutional: Negative for malaise/fatigue and weight loss.  HENT: Negative for hearing loss and tinnitus.   Eyes: Negative for blurred vision and double vision.  Respiratory: Negative for cough, shortness of breath and wheezing.   Cardiovascular: Negative for chest pain, palpitations, orthopnea, claudication and leg swelling.  Gastrointestinal:  Negative for abdominal pain, blood in stool, constipation, diarrhea, heartburn, melena, nausea and vomiting.  Genitourinary: Negative.   Musculoskeletal: Negative for joint pain and myalgias.  Skin: Negative for rash.  Neurological: Negative for dizziness, tingling, sensory change, weakness and headaches.  Endo/Heme/Allergies: Negative for polydipsia.  Psychiatric/Behavioral: Negative.   All other systems reviewed and are negative.     Physical Exam: BP 124/76   Pulse 95   Temp 97.7 F (36.5 C)   Ht 6\' 3"  (1.905 m)   Wt 256 lb (116.1 kg)   SpO2 97%   BMI 32.00 kg/m  Wt Readings from Last 3 Encounters:  05/08/19 256 lb (116.1 kg)  02/06/19 258 lb (117 kg)  07/24/18 239 lb (108.4 kg)   General Appearance: Well nourished, in no apparent distress. Eyes: PERRLA, EOMs, conjunctiva no swelling or erythema Sinuses: No Frontal/maxillary tenderness ENT/Mouth: Ext aud canals clear, TMs without erythema, bulging. No erythema, swelling, or exudate on post pharynx.  Tonsils not swollen or erythematous. Hearing normal.  Neck: Supple, thyroid normal.  Respiratory: Respiratory effort normal, BS equal bilaterally without rales, rhonchi, wheezing or stridor.  Cardio: RRR with no MRGs. Brisk peripheral pulses without edema.  Abdomen: Soft, + BS.  Non tender, no guarding, rebound, hernias, masses. Lymphatics: Non tender without lymphadenopathy.  Musculoskeletal: Full ROM, 5/5 strength, Normal gait Skin: Warm, dry without rashes, lesions, ecchymosis.  Neuro: Cranial nerves intact. No cerebellar symptoms.  Psych: Awake and oriented X 3, normal affect, Insight and Judgment appropriate.    Izora Ribas, NP 9:58 AM [facility]

## 2019-05-08 ENCOUNTER — Encounter: Payer: Self-pay | Admitting: Adult Health

## 2019-05-08 ENCOUNTER — Other Ambulatory Visit: Payer: Self-pay

## 2019-05-08 ENCOUNTER — Ambulatory Visit: Payer: 59 | Admitting: Adult Health

## 2019-05-08 VITALS — BP 124/76 | HR 95 | Temp 97.7°F | Ht 75.0 in | Wt 256.0 lb

## 2019-05-08 DIAGNOSIS — E785 Hyperlipidemia, unspecified: Secondary | ICD-10-CM

## 2019-05-08 DIAGNOSIS — E559 Vitamin D deficiency, unspecified: Secondary | ICD-10-CM

## 2019-05-08 DIAGNOSIS — R030 Elevated blood-pressure reading, without diagnosis of hypertension: Secondary | ICD-10-CM | POA: Diagnosis not present

## 2019-05-08 DIAGNOSIS — E663 Overweight: Secondary | ICD-10-CM

## 2019-05-08 MED ORDER — ROSUVASTATIN CALCIUM 5 MG PO TABS: ORAL_TABLET | ORAL | 1 refills | Status: DC

## 2019-05-08 NOTE — Patient Instructions (Signed)
Goals    . Blood Pressure < 120/80    . LDL CALC < 100    . Weight (lb) < 245 lb (111.1 kg)        Drink 1/2 your body weight in fluid ounces of water daily; drink a tall glass of water 30 min before meals  Don't eat until you're stuffed- listen to your stomach and eat until you are 80% full   Try eating off of a salad plate; wait 10 min after finishing before going back for seconds  Start by eating the vegetables on your plate; aim for 16%50% of your meals to be fruits or vegetables  Then eat your protein - lean meats (grass fed if possible), fish, beans, nuts in moderation  Eat your carbs/starch last ONLY if you still are hungry. If you can, stop before finishing it all  Avoid sugar and flour - the closer it looks to it's original form in nature, typically the better it is for you  Splurge in moderation - "assign" days when you get to splurge and have the "bad stuff" - I like to follow a 80% - 20% plan- "good" choices 80 % of the time, "bad" choices in moderation 20% of the time  Simple equation is: Calories out > calories in = weight loss - even if you eat the bad stuff, if you limit portions, you will still lose weight    SMALL CHANGES  We want weight loss that will last so you should lose 1-2 pounds a week.  THAT IS IT! Please pick THREE things a month to change. Once it is a habit check off the item. Then pick another three items off the list to become habits.  If you are already doing a habit on the list GREAT!  Cross that item off! o Don't drink your calories. Ie, alcohol, soda, fruit juice, and sweet tea.  o Drink more water. Drink a glass when you feel hungry or before each meal.  o Eat breakfast - Complex carb and protein (likeDannon light and fit yogurt, oatmeal, fruit, eggs, [place] bacon). o Measure your cereal.  Eat no more than one cup a day. (ie [place]) o Eat an apple a day. o Add a vegetable a day. o Try a new vegetable a month. o Use Pam! Stop using oil or butter  to cook. o Don't finish your plate or use smaller plates. o Share your dessert. o Eat sugar free Jello for dessert or frozen grapes. o Don't eat 2-3 hours before bed. o Switch to whole wheat bread, pasta, and brown rice. o Make healthier choices when you eat out. No fries! o Pick baked chicken, NOT fried. o Don't forget to SLOW DOWN when you eat. It is not going anywhere.  o Take the stairs. o Park far away in the parking lot o [employer] soup cans (or weights) for 10 minutes while watching TV. o Walk at work for 10 minutes during break. o Walk outside 1 time a week with your friend, kids, dog, or significant other. o Start a walking group at church. o Walk the mall as much as you can tolerate.  o Keep a food diary. o Weigh yourself daily. o Walk for 15 minutes 3 days per week. o Cook at home more often and eat out less.  If life happens and you go back to old habits, it is okay.  Just start over. You can do it!   If you experience chest pain, get  short of breath, or tired during the exercise, please stop immediately and inform your doctor.

## 2019-05-09 LAB — LIPID PANEL
Cholesterol: 192 mg/dL (ref <200)
HDL: 34 mg/dL — ABNORMAL LOW (ref >=40)
LDL Cholesterol (Calc): 132 mg/dL (calc) — ABNORMAL HIGH
Non-HDL Cholesterol (Calc): 158 mg/dL (calc) — ABNORMAL HIGH (ref <130)
Total CHOL/HDL Ratio: 5.6 (calc) — ABNORMAL HIGH (ref <5.0)
Triglycerides: 146 mg/dL (ref <150)

## 2019-05-09 LAB — COMPLETE METABOLIC PANEL WITH GFR
AG Ratio: 1.8 (calc) (ref 1.0–2.5)
ALT: 15 U/L (ref 9–46)
AST: 14 U/L (ref 10–40)
Albumin: 4.4 g/dL (ref 3.6–5.1)
Alkaline phosphatase (APISO): 77 U/L (ref 36–130)
BUN: 14 mg/dL (ref 7–25)
CO2: 27 mmol/L (ref 20–32)
Calcium: 9.8 mg/dL (ref 8.6–10.3)
Chloride: 107 mmol/L (ref 98–110)
Creat: 0.96 mg/dL (ref 0.60–1.35)
GFR, Est African American: 116 mL/min/{1.73_m2} (ref >=60)
GFR, Est Non African American: 100 mL/min/{1.73_m2} (ref >=60)
Globulin: 2.5 g/dL (calc) (ref 1.9–3.7)
Glucose, Bld: 100 mg/dL — ABNORMAL HIGH (ref 65–99)
Potassium: 4.1 mmol/L (ref 3.5–5.3)
Sodium: 141 mmol/L (ref 135–146)
Total Bilirubin: 0.4 mg/dL (ref 0.2–1.2)
Total Protein: 6.9 g/dL (ref 6.1–8.1)

## 2019-07-30 ENCOUNTER — Encounter: Payer: Self-pay | Admitting: Adult Health

## 2019-08-07 NOTE — Progress Notes (Signed)
Complete Physical  Assessment and Plan:  Stuart Newman was seen today for annual exam. 38  Diagnoses and all orders for this visit:  Encounter for routine adult health examination without abnormal findings  Labile hypertension Recently at goal without medications Monitor blood pressure at home/work; call if consistently over 130/80 Discussed DASH diet.   Reminder to go to the ER if any CP, SOB, nausea, dizziness, severe HA, changes vision/speech, left arm numbness and tingling and jaw pain.  Hyperlipidemia, unspecified hyperlipidemia type Newly on statin for LDL goal <100; titrate for goal  Continue low cholesterol diet and exercise.  Check lipid panel. -     Lipid panel -     TSH  Obesity (BMI 30.0-34.9) Long discussion about weight loss, diet, and exercise Recommended diet heavy in fruits and veggies and low in animal meats, cheeses, and dairy products, appropriate calorie intake Discussed appropriate weight for height  Follow up at next visit -     Hemoglobin A1c  Medication management -     CBC with Differential/Platelet -     COMPLETE METABOLIC PANEL WITH GFR -     Magnesium -     Urinalysis w microscopic + reflex cultur  Screening for hematuria or proteinuria -     Urinalysis w microscopic + reflex cultur  Screening for diabetes mellitus -     Hemoglobin A1c  Vitamin D deficiency Discussed goal of 69, benefits Get on supplement, double up if forgetting to take -     VITAMIN D 25 Hydroxy (Vit-D Deficiency, Fractures)   Discussed med's effects and SE's. Screening labs and tests as requested with regular follow-up as recommended. Over 40 minutes of exam, counseling, chart review and critical decision making was performed  Future Appointments  Date Time Provider Department Center  08/11/2019 10:00 AM Judd Gaudierorbett, Ezreal Turay, NP [facility] None  08/10/2020 10:00 AM Judd Gaudierorbett, Andi Mahaffy, NP [facility] None    HPI  This very nice 38 y.o.male presents for complete physical. He has  Labile hypertension; Obesity (BMI 30.0-34.9); and Vitamin D deficiency on their problem list.   Patient reports no complaints at this time.   He is married, 1 child - boy, 89 years old and doing well.  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   BMI is Body mass index is 33.5 kg/m., he has been working on diet and exercise. He has been eating more veggies and back on [place] diet, lean proteins, high fiber, low carb fruits. He is working towards 245 lb goal initially;  Wt Readings from Last 3 Encounters:  08/11/19 268 lb (121.6 kg)  05/08/19 256 lb (116.1 kg)  02/06/19 258 lb (117 kg)   His blood pressure has been intermittently elevated in the past; checks regularly and has been controlled at home, today their BP is BP: 124/76  He does not workout but has physically active job, [employer] part time and college teaching part time. He denies chest pain, shortness of breath, dizziness.    He is on cholesterol medication (rosuvastatin 5 mg daily, up from 3 days/week) and denies myalgias. His cholesterol is not at goal. The cholesterol last visit was:   Lab Results  Component Value Date   CHOL 192 05/08/2019   HDL 34 (L) 05/08/2019   LDLCALC 132 (H) 05/08/2019   TRIG 146 05/08/2019   CHOLHDL 5.6 (H) 05/08/2019   Last A1C in the office was:  Lab Results  Component Value Date   HGBA1C 5.5 07/24/2018   Lab  Results  Component Value Date   GFRNONAA 100 05/08/2019   Patient is not consistently on Vitamin D supplement, takes 2000 IU    Lab Results  Component Value Date   VD25OH 42 07/24/2018       Current Medications:  Current Outpatient Medications on File Prior to Visit  Medication Sig Dispense Refill  . meloxicam (MOBIC) 15 MG tablet Take 1 tablet (15 mg total) by mouth daily. (Patient taking differently: Take 15 mg by mouth as needed. ) 90 tablet 0  . rosuvastatin (CRESTOR) 5 MG tablet Take 1 tab by mouth daily for cholesterol. 90 tablet 1   No current  facility-administered medications on file prior to visit.    Health Maintenance:   Immunization History  Administered Date(s) Administered  . Tdap 07/17/2017    TD/TDAP: 2018 Influenza: declines  Pneumovax: -   CXR: 2013 RUQ [place]: 2017   Colonoscopy: due age 52 EGD: n/a  Vision: no issues, last remote, 20/20 vision at work physical Dental: Dr. Autumn Patty, last visit 2020, goes q9m Derm: Dr. ?, had full screen 2019  Allergies:  Allergies  Allergen Reactions  . Penicillins Hives, Shortness Of Breath and Swelling   Medical History:  has Labile hypertension; Obesity (BMI 30.0-34.9); and Vitamin D deficiency on their problem list. Surgical History:  He  has a past surgical history that includes Skin graft. Family History:  Hisfamily history includes Hyperlipidemia in his father; Hypertension in his brother, father, and mother; Pancreatic cancer in his maternal grandfather and maternal grandmother. Social History:   reports that he has never smoked. He has never used smokeless tobacco. He reports current alcohol use of about 1.0 standard drinks of alcohol per week. He reports that he does not use drugs.  Review of Systems: Review of Systems  Constitutional: Negative.  Negative for malaise/fatigue and weight loss.  HENT: Negative.  Negative for hearing loss and tinnitus.   Eyes: Negative.  Negative for blurred vision and double vision.  Respiratory: Negative.  Negative for cough, sputum production, shortness of breath and wheezing.   Cardiovascular: Negative.  Negative for chest pain, palpitations, orthopnea, claudication, leg swelling and PND.  Gastrointestinal: Negative.  Negative for abdominal pain, blood in stool, constipation, diarrhea, heartburn, melena, nausea and vomiting.  Genitourinary: Negative.   Musculoskeletal: Negative.  Negative for falls, joint pain and myalgias.  Skin: Negative.  Negative for rash.  Neurological: Negative for dizziness, tingling, sensory  change, weakness and headaches.  Endo/Heme/Allergies: Negative for polydipsia.  Psychiatric/Behavioral: Negative.  Negative for depression, memory loss, substance abuse and suicidal ideas. The patient is not nervous/anxious and does not have insomnia.   All other systems reviewed and are negative.   Physical Exam: Estimated body mass index is 33.5 kg/m as calculated from the following:   Height as of this encounter: 6\' 3"  (1.905 m).   Weight as of this encounter: 268 lb (121.6 kg). BP 124/76   Pulse 76   Temp (!) 97.3 F (36.3 C)   Ht 6\' 3"  (1.905 m)   Wt 268 lb (121.6 kg)   SpO2 98%   BMI 33.50 kg/m  General Appearance: Well nourished, in no apparent distress.  Eyes: PERRLA, EOMs, conjunctiva no swelling or erythema  Sinuses: No Frontal/maxillary tenderness  ENT/Mouth: Ext aud canals clear, normal light reflex with TMs without erythema, bulging. Good dentition. No erythema, swelling, or exudate on post pharynx. Tonsils not swollen or erythematous. Hearing normal.  Neck: Supple, thyroid normal. No bruits  Respiratory: Respiratory effort normal,  BS equal bilaterally without rales, rhonchi, wheezing or stridor.  Cardio: RRR without murmurs, rubs or gallops. Brisk peripheral pulses without edema.  Chest: symmetric, with normal excursions and percussion.  Abdomen: Soft, nontender, no guarding, rebound, hernias, masses, or organomegaly.  Lymphatics: Non tender without lymphadenopathy.  Genitourinary: declines, no concerns Musculoskeletal: Full ROM all peripheral extremities, 5/5 strength, and normal gait.  Skin: Warm, dry without rashes, lesions, ecchymosis. Neuro: Cranial nerves intact, reflexes equal bilaterally. Normal muscle tone, no cerebellar symptoms. Sensation intact.  Psych: Awake and oriented X 3, normal affect, Insight and Judgment appropriate.   EKG: defer, baseline in chart, gets every other year at work [place] 9:55 AM [facility]

## 2019-08-11 ENCOUNTER — Encounter: Payer: Self-pay | Admitting: Adult Health

## 2019-08-11 ENCOUNTER — Ambulatory Visit: Payer: 59 | Admitting: Adult Health

## 2019-08-11 ENCOUNTER — Other Ambulatory Visit: Payer: Self-pay

## 2019-08-11 VITALS — BP 124/76 | HR 76 | Temp 97.3°F | Ht 75.0 in | Wt 268.0 lb

## 2019-08-11 DIAGNOSIS — E669 Obesity, unspecified: Secondary | ICD-10-CM

## 2019-08-11 DIAGNOSIS — R0989 Other specified symptoms and signs involving the circulatory and respiratory systems: Secondary | ICD-10-CM

## 2019-08-11 DIAGNOSIS — E66811 Obesity, class 1: Secondary | ICD-10-CM

## 2019-08-11 DIAGNOSIS — Z79899 Other long term (current) drug therapy: Secondary | ICD-10-CM

## 2019-08-11 DIAGNOSIS — Z1329 Encounter for screening for other suspected endocrine disorder: Secondary | ICD-10-CM

## 2019-08-11 DIAGNOSIS — E559 Vitamin D deficiency, unspecified: Secondary | ICD-10-CM

## 2019-08-11 DIAGNOSIS — R030 Elevated blood-pressure reading, without diagnosis of hypertension: Secondary | ICD-10-CM

## 2019-08-11 DIAGNOSIS — Z131 Encounter for screening for diabetes mellitus: Secondary | ICD-10-CM

## 2019-08-11 DIAGNOSIS — Z0000 Encounter for general adult medical examination without abnormal findings: Secondary | ICD-10-CM

## 2019-08-11 DIAGNOSIS — Z0001 Encounter for general adult medical examination with abnormal findings: Secondary | ICD-10-CM

## 2019-08-11 DIAGNOSIS — E782 Mixed hyperlipidemia: Secondary | ICD-10-CM

## 2019-08-11 NOTE — Patient Instructions (Addendum)
  [name] , Thank you for taking time to come for your Annual Wellness Visit. I appreciate your ongoing commitment to your health goals. Please review the following plan we discussed and let me know if I can assist you in the future.   These are the goals we discussed: Goals    . Blood Pressure < 120/80    . LDL CALC < 100    . Weight (lb) < 245 lb (111.1 kg)       This is a list of the screening recommended for you and due dates:  Health Maintenance  Topic Date Due  . Flu Shot  01/28/2020*  . Tetanus Vaccine  07/18/2027  . HIV Screening  Completed  *Topic was postponed. The date shown is not the original due date.     Know what a healthy weight is for you (roughly BMI <25) and aim to maintain this  Aim for 7+ servings of fruits and vegetables daily  65-80+ fluid ounces of water or unsweet tea for healthy kidneys  Limit to max 1 drink of alcohol per day; avoid smoking/tobacco  Limit animal fats in diet for cholesterol and heart health - choose grass fed whenever available  Avoid highly processed foods, and foods high in saturated/trans fats  Aim for low stress - take time to unwind and care for your mental health  Aim for 150 min of moderate intensity exercise weekly for heart health, and weights twice weekly for bone health  Aim for 7-9 hours of sleep daily       When it comes to diets, agreement about the perfect plan isn't easy to find, even among the experts. Experts at the [employer] developed an idea known as the Healthy Eating Plate. Just imagine a plate divided into logical, healthy portions.  The emphasis is on diet quality:  Load up on vegetables and fruits - one-half of your plate: Aim for color and variety, and remember that potatoes don't count.  Go for whole grains - one-quarter of your plate: Whole wheat, barley, wheat berries, quinoa, oats, brown rice, and foods made with them. If you want pasta, go with whole wheat  pasta.  Protein power - one-quarter of your plate: Fish, chicken, beans, and nuts are all healthy, versatile protein sources. Limit red meat.  The diet, however, does go beyond the plate, offering a few other suggestions.  Use healthy plant oils, such as olive, canola, soy, corn, sunflower and peanut. Check the labels, and avoid partially hydrogenated oil, which have unhealthy trans fats.  If you're thirsty, drink water. Coffee and tea are good in moderation, but skip sugary drinks and limit milk and dairy products to one or two daily servings.  The type of carbohydrate in the diet is more important than the amount. Some sources of carbohydrates, such as vegetables, fruits, whole grains, and beans-are healthier than others.  Finally, stay active.

## 2019-08-12 LAB — CBC WITH DIFFERENTIAL/PLATELET
Absolute Monocytes: 624 cells/uL (ref 200–950)
Basophils Absolute: 50 cells/uL (ref 0–200)
Basophils Relative: 0.8 %
Eosinophils Absolute: 170 cells/uL (ref 15–500)
Eosinophils Relative: 2.7 %
HCT: 44.8 % (ref 38.5–50.0)
Hemoglobin: 14.8 g/dL (ref 13.2–17.1)
Lymphs Abs: 2507 cells/uL (ref 850–3900)
MCH: 29.9 pg (ref 27.0–33.0)
MCHC: 33 g/dL (ref 32.0–36.0)
MCV: 90.5 fL (ref 80.0–100.0)
MPV: 11.2 fL (ref 7.5–12.5)
Monocytes Relative: 9.9 %
Neutro Abs: 2948 cells/uL (ref 1500–7800)
Neutrophils Relative %: 46.8 %
Platelets: 244 10*3/uL (ref 140–400)
RBC: 4.95 10*6/uL (ref 4.20–5.80)
RDW: 12.9 % (ref 11.0–15.0)
Total Lymphocyte: 39.8 %
WBC: 6.3 10*3/uL (ref 3.8–10.8)

## 2019-08-12 LAB — TEST AUTHORIZATION

## 2019-08-12 LAB — URINALYSIS, ROUTINE W REFLEX MICROSCOPIC
Bilirubin Urine: NEGATIVE
Glucose, UA: NEGATIVE
Hgb urine dipstick: NEGATIVE
Ketones, ur: NEGATIVE
Leukocytes,Ua: NEGATIVE
Nitrite: NEGATIVE
Protein, ur: NEGATIVE
Specific Gravity, Urine: 1.008 (ref 1.001–1.03)
pH: 6.5 (ref 5.0–8.0)

## 2019-08-12 LAB — LIPID PANEL
Cholesterol: 147 mg/dL (ref <200)
HDL: 38 mg/dL — ABNORMAL LOW (ref >=40)
LDL Cholesterol (Calc): 86 mg/dL (calc)
Non-HDL Cholesterol (Calc): 109 mg/dL (calc) (ref <130)
Total CHOL/HDL Ratio: 3.9 (calc) (ref <5.0)
Triglycerides: 131 mg/dL (ref <150)

## 2019-08-12 LAB — COMPLETE METABOLIC PANEL WITH GFR
AG Ratio: 1.7 (calc) (ref 1.0–2.5)
ALT: 20 U/L (ref 9–46)
AST: 18 U/L (ref 10–40)
Albumin: 4.5 g/dL (ref 3.6–5.1)
Alkaline phosphatase (APISO): 80 U/L (ref 36–130)
BUN: 14 mg/dL (ref 7–25)
CO2: 27 mmol/L (ref 20–32)
Calcium: 9.7 mg/dL (ref 8.6–10.3)
Chloride: 106 mmol/L (ref 98–110)
Creat: 1 mg/dL (ref 0.60–1.35)
GFR, Est African American: 110 mL/min/{1.73_m2} (ref >=60)
GFR, Est Non African American: 95 mL/min/{1.73_m2} (ref >=60)
Globulin: 2.6 g/dL (calc) (ref 1.9–3.7)
Glucose, Bld: 102 mg/dL — ABNORMAL HIGH (ref 65–99)
Potassium: 4.3 mmol/L (ref 3.5–5.3)
Sodium: 140 mmol/L (ref 135–146)
Total Bilirubin: 0.4 mg/dL (ref 0.2–1.2)
Total Protein: 7.1 g/dL (ref 6.1–8.1)

## 2019-08-12 LAB — HEMOGLOBIN A1C
Hgb A1c MFr Bld: 5.3 % of total Hgb (ref <5.7)
Mean Plasma Glucose: 105 (calc)
eAG (mmol/L): 5.8 (calc)

## 2019-08-12 LAB — MAGNESIUM: Magnesium: 2.1 mg/dL (ref 1.5–2.5)

## 2019-08-12 LAB — VITAMIN D 25 HYDROXY (VIT D DEFICIENCY, FRACTURES): Vit D, 25-Hydroxy: 39 ng/mL (ref 30–100)

## 2019-08-12 LAB — TSH: TSH: 1.18 mIU/L (ref 0.40–4.50)

## 2019-11-21 ENCOUNTER — Other Ambulatory Visit: Payer: Self-pay | Admitting: Adult Health

## 2019-11-21 DIAGNOSIS — E785 Hyperlipidemia, unspecified: Secondary | ICD-10-CM

## 2020-02-05 NOTE — Progress Notes (Signed)
FOLLOW UP  Assessment and Plan:   History of elevated BP without dx of htn Well controlled at this time without medications Monitor blood pressure at home; patient to call if consistently greater than 130/80 Continue DASH diet.   Reminder to go to the ER if any CP, SOB, nausea, dizziness, severe HA, changes vision/speech, left arm numbness and tingling and jaw pain.  Cholesterol Newly on rosuvastatin with improved values, tolerating without issues Continue low cholesterol diet and exercise.  Check lipid panel.   Obesity Long discussion about weight loss, diet, and exercise Recommended diet heavy in fruits and veggies and low in animal meats, cheeses, and dairy products, appropriate calorie intake Discussed ideal weight for height  and initial weight goal (<245 lb) Discussed reducing portions, focus on increasing fresh fruits/veggies/whole grains/bean Will follow up in 6 months  Vitamin D Def Below goal at last visit; he did increase dose to 4000 IU  continue supplementation to maintain goal of 60-100 Defer Vit D level to CPE  Continue diet and meds as discussed. Further disposition pending results of labs. Discussed med's effects and SE's.   Over 30 minutes of exam, counseling, chart review, and critical decision making was performed.   Future Appointments  Date Time Provider [place]  08/10/2020 10:00 AM Liane Comber, NP [facility] None    ----------------------------------------------------------------------------------------------------------------------  HPI 39 y.o. male  presents for 6 month follow up on labile BP, cholesterol, obesity and vitamin D deficiency.   He is married, 1 child - boy, 44 years old and doing well. 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   BMI is Body mass index is 32.5 kg/m., he has been working on diet, down 10 lb since holidays. Walking his new dog daily ~2 miles daily.  Wt  Readings from Last 3 Encounters:  02/11/20 260 lb (117.9 kg)  08/11/19 268 lb (121.6 kg)  05/08/19 256 lb (116.1 kg)   His blood pressure has been controlled at home, today their BP is BP: 124/78  He does workout. He denies chest pain, shortness of breath, dizziness.   He is on cholesterol medication, taking rosuvastatin 5 mg daily. His cholesterol is at goal. The cholesterol last visit was:   Lab Results  Component Value Date   CHOL 147 08/11/2019   HDL 38 (L) 08/11/2019   LDLCALC 86 08/11/2019   TRIG 131 08/11/2019   CHOLHDL 3.9 08/11/2019    He has been working on diet and exercise for glucose management, and denies increased appetite, nausea, paresthesia of the feet, polydipsia, polyuria and visual disturbances. Last A1C in the office was:  Lab Results  Component Value Date   HGBA1C 5.3 08/11/2019   Patient is on Vitamin D supplement, has increased from 2000 IU to 4000 IU   Lab Results  Component Value Date   VD25OH 39 08/11/2019        Current Medications:  Current Outpatient Medications on File Prior to Visit  Medication Sig  . CHOLECALCIFEROL PO Take 2,000 Units by mouth daily.  . rosuvastatin (CRESTOR) 5 MG tablet TAKE 1 TABLET BY MOUTH DAILY FOR CHOLESTEROL   No current facility-administered medications on file prior to visit.     Allergies:  Allergies  Allergen Reactions  . Penicillins Hives, Shortness Of Breath and Swelling     Medical History:  Past Medical History:  Diagnosis Date  . Hyperlipidemia 07/18/2017  . Labile hypertension    Family history-  Reviewed and unchanged Social history- Reviewed and unchanged   Review of Systems:  Review of Systems  Constitutional: Negative for malaise/fatigue and weight loss.  HENT: Negative for hearing loss and tinnitus.   Eyes: Negative for blurred vision and double vision.  Respiratory: Negative for cough, shortness of breath and wheezing.   Cardiovascular: Negative for chest pain, palpitations,  orthopnea, claudication and leg swelling.  Gastrointestinal: Negative for abdominal pain, blood in stool, constipation, diarrhea, heartburn, melena, nausea and vomiting.  Genitourinary: Negative.   Musculoskeletal: Negative for joint pain and myalgias.  Skin: Negative for rash.  Neurological: Negative for dizziness, tingling, sensory change, weakness and headaches.  Endo/Heme/Allergies: Negative for polydipsia.  Psychiatric/Behavioral: Negative.   All other systems reviewed and are negative.     Physical Exam: BP 124/78   Pulse 84   Temp 97.9 F (36.6 C)   Ht 6\' 3"  (1.905 m)   Wt 260 lb (117.9 kg)   SpO2 99%   BMI 32.50 kg/m  Wt Readings from Last 3 Encounters:  02/11/20 260 lb (117.9 kg)  08/11/19 268 lb (121.6 kg)  05/08/19 256 lb (116.1 kg)   General Appearance: Well nourished, in no apparent distress. Eyes: PERRLA, EOMs, conjunctiva no swelling or erythema Sinuses: No Frontal/maxillary tenderness ENT/Mouth: Ext aud canals clear, TMs without erythema, bulging. No erythema, swelling, or exudate on post pharynx.  Tonsils not swollen or erythematous. Hearing normal.  Neck: Supple, thyroid normal.  Respiratory: Respiratory effort normal, BS equal bilaterally without rales, rhonchi, wheezing or stridor.  Cardio: RRR with no MRGs. Brisk peripheral pulses without edema.  Abdomen: Soft, + BS.  Non tender, no guarding, rebound, hernias, masses. Lymphatics: Non tender without lymphadenopathy.  Musculoskeletal: Full ROM, 5/5 strength, Normal gait Skin: Warm, dry without rashes, lesions, ecchymosis.  Neuro: Cranial nerves intact. No cerebellar symptoms.  Psych: Awake and oriented X 3, normal affect, Insight and Judgment appropriate.    07/09/19, NP 8:53 AM [facility]

## 2020-02-11 ENCOUNTER — Encounter: Payer: Self-pay | Admitting: Adult Health

## 2020-02-11 ENCOUNTER — Other Ambulatory Visit: Payer: Self-pay

## 2020-02-11 ENCOUNTER — Ambulatory Visit: Payer: 59 | Admitting: Adult Health

## 2020-02-11 VITALS — BP 124/78 | HR 84 | Temp 97.9°F | Ht 75.0 in | Wt 260.0 lb

## 2020-02-11 DIAGNOSIS — E782 Mixed hyperlipidemia: Secondary | ICD-10-CM

## 2020-02-11 DIAGNOSIS — R0989 Other specified symptoms and signs involving the circulatory and respiratory systems: Secondary | ICD-10-CM | POA: Diagnosis not present

## 2020-02-11 DIAGNOSIS — E559 Vitamin D deficiency, unspecified: Secondary | ICD-10-CM | POA: Diagnosis not present

## 2020-02-11 DIAGNOSIS — E66811 Obesity, class 1: Secondary | ICD-10-CM

## 2020-02-11 DIAGNOSIS — Z79899 Other long term (current) drug therapy: Secondary | ICD-10-CM

## 2020-02-11 DIAGNOSIS — E669 Obesity, unspecified: Secondary | ICD-10-CM

## 2020-02-11 NOTE — Patient Instructions (Addendum)
Goals    . Blood Pressure < 120/80    . DIET - EAT MORE FRUITS AND VEGETABLES     Aim for 7+ servings of a variety of fresh fruits and veggies (1/2 cup each) daily     . LDL CALC < 100    . Weight (lb) < 245 lb (111.1 kg)       Aim to get 2 servings of fresh fruit and 5 servings of fresh veggies of a variety daily  Also a good goal to include: at least a serving daily of beans, whole grain (quinoa, whole oats, etc), mushrooms, berries, mushrooms, onion/garlic, green leafy veggies, cruciferous veggie, nuts daily     High-Fiber Diet Fiber, also called dietary fiber, is a type of carbohydrate that is found in fruits, vegetables, whole grains, and beans. A high-fiber diet can have many health benefits. Your health care provider may recommend a high-fiber diet to help:  Prevent constipation. Fiber can make your bowel movements more regular.  Lower your cholesterol.  Relieve the following conditions: ? Swelling of veins in the anus (hemorrhoids). ? Swelling and irritation (inflammation) of specific areas of the digestive tract (uncomplicated diverticulosis). ? A problem of the large intestine (colon) that sometimes causes pain and diarrhea (irritable bowel syndrome, IBS).  Prevent overeating as part of a weight-loss plan.  Prevent heart disease, type 2 diabetes, and certain cancers. What is my plan? The recommended daily fiber intake in grams (g) includes:  38 g for men age 40 or younger.  30 g for men over age 44.  25 g for women age 58 or younger.  21 g for women over age 70. You can get the recommended daily intake of dietary fiber by:  Eating a variety of fruits, vegetables, grains, and beans.  Taking a fiber supplement, if it is not possible to get enough fiber through your diet. What do I need to know about a high-fiber diet?  It is better to get fiber through food sources rather than from fiber supplements. There is not a lot of research about how effective  supplements are.  Always check the fiber content on the nutrition facts label of any prepackaged food. Look for foods that contain 5 g of fiber or more per serving.  Talk with a diet and nutrition specialist (dietitian) if you have questions about specific foods that are recommended or not recommended for your medical condition, especially if those foods are not listed below.  Gradually increase how much fiber you consume. If you increase your intake of dietary fiber too quickly, you may have bloating, cramping, or gas.  Drink plenty of water. Water helps you to digest fiber. What are tips for following this plan?  Eat a wide variety of high-fiber foods.  Make sure that half of the grains that you eat each day are whole grains.  Eat breads and cereals that are made with whole-grain flour instead of refined flour or white flour.  Eat brown rice, bulgur wheat, or millet instead of white rice.  Start the day with a breakfast that is high in fiber, such as a cereal that contains 5 g of fiber or more per serving.  Use beans in place of meat in soups, salads, and pasta dishes.  Eat high-fiber snacks, such as berries, raw vegetables, nuts, and popcorn.  Choose whole fruits and vegetables instead of processed forms like juice or sauce. What foods can I eat?  Fruits Berries. Pears. Apples. Oranges. Avocado. Prunes and  raisins. Dried figs. Vegetables Sweet potatoes. Spinach. Kale. Artichokes. Cabbage. Broccoli. Cauliflower. Green peas. Carrots. Squash. Grains Whole-grain breads. Multigrain cereal. Oats and oatmeal. Brown rice. Barley. Bulgur wheat. [place]. Quinoa. Bran muffins. Popcorn. Rye wafer crackers. Meats and other proteins Navy, kidney, and pinto beans. Soybeans. Split peas. Lentils. Nuts and seeds. Dairy Fiber-fortified yogurt. Beverages Fiber-fortified soy milk. Fiber-fortified orange juice. Other foods Fiber bars. The items listed above may not be a complete list of  recommended foods and beverages. Contact a dietitian for more options. What foods are not recommended? Fruits Fruit juice. Cooked, strained fruit. Vegetables Fried potatoes. Canned vegetables. Well-cooked vegetables. Grains White bread. Pasta made with refined flour. White rice. Meats and other proteins Fatty cuts of meat. Fried chicken or fried fish. Dairy Milk. Yogurt. Cream cheese. Sour cream. Fats and oils Butters. Beverages Soft drinks. Other foods Cakes and pastries. The items listed above may not be a complete list of foods and beverages to avoid. Contact a dietitian for more information. Summary  Fiber is a type of carbohydrate. It is found in fruits, vegetables, whole grains, and beans.  There are many health benefits of eating a high-fiber diet, such as preventing constipation, lowering blood cholesterol, helping with weight loss, and reducing your risk of heart disease, diabetes, and certain cancers.  Gradually increase your intake of fiber. Increasing too fast can result in cramping, bloating, and gas. Drink plenty of water while you increase your fiber.  The best sources of fiber include whole fruits and vegetables, whole grains, nuts, seeds, and beans. This information is not intended to replace advice given to you by your health care provider. Make sure you discuss any questions you have with your health care provider. Document Revised: 08/20/2017 Document Reviewed: 08/20/2017 Elsevier Patient Education  2020 [employer].

## 2020-02-12 LAB — COMPLETE METABOLIC PANEL WITH GFR
AG Ratio: 1.9 (calc) (ref 1.0–2.5)
ALT: 23 U/L (ref 9–46)
AST: 25 U/L (ref 10–40)
Albumin: 4.7 g/dL (ref 3.6–5.1)
Alkaline phosphatase (APISO): 93 U/L (ref 36–130)
BUN: 17 mg/dL (ref 7–25)
CO2: 28 mmol/L (ref 20–32)
Calcium: 9.9 mg/dL (ref 8.6–10.3)
Chloride: 107 mmol/L (ref 98–110)
Creat: 0.94 mg/dL (ref 0.60–1.35)
GFR, Est African American: 118 mL/min/{1.73_m2} (ref >=60)
GFR, Est Non African American: 102 mL/min/{1.73_m2} (ref >=60)
Globulin: 2.5 g/dL (calc) (ref 1.9–3.7)
Glucose, Bld: 101 mg/dL — ABNORMAL HIGH (ref 65–99)
Potassium: 4.2 mmol/L (ref 3.5–5.3)
Sodium: 140 mmol/L (ref 135–146)
Total Bilirubin: 0.4 mg/dL (ref 0.2–1.2)
Total Protein: 7.2 g/dL (ref 6.1–8.1)

## 2020-02-12 LAB — LIPID PANEL
Cholesterol: 171 mg/dL (ref <200)
HDL: 36 mg/dL — ABNORMAL LOW (ref >=40)
LDL Cholesterol (Calc): 107 mg/dL (calc) — ABNORMAL HIGH
Non-HDL Cholesterol (Calc): 135 mg/dL (calc) — ABNORMAL HIGH (ref <130)
Total CHOL/HDL Ratio: 4.8 (calc) (ref <5.0)
Triglycerides: 164 mg/dL — ABNORMAL HIGH (ref <150)

## 2020-02-12 LAB — CBC WITH DIFFERENTIAL/PLATELET
Absolute Monocytes: 655 cells/uL (ref 200–950)
Basophils Absolute: 47 cells/uL (ref 0–200)
Basophils Relative: 0.8 %
Eosinophils Absolute: 148 cells/uL (ref 15–500)
Eosinophils Relative: 2.5 %
HCT: 44.6 % (ref 38.5–50.0)
Hemoglobin: 14.9 g/dL (ref 13.2–17.1)
Lymphs Abs: 2254 cells/uL (ref 850–3900)
MCH: 30.3 pg (ref 27.0–33.0)
MCHC: 33.4 g/dL (ref 32.0–36.0)
MCV: 90.8 fL (ref 80.0–100.0)
MPV: 10.8 fL (ref 7.5–12.5)
Monocytes Relative: 11.1 %
Neutro Abs: 2797 cells/uL (ref 1500–7800)
Neutrophils Relative %: 47.4 %
Platelets: 235 10*3/uL (ref 140–400)
RBC: 4.91 10*6/uL (ref 4.20–5.80)
RDW: 13.1 % (ref 11.0–15.0)
Total Lymphocyte: 38.2 %
WBC: 5.9 10*3/uL (ref 3.8–10.8)

## 2020-02-12 LAB — TSH: TSH: 1.21 mIU/L (ref 0.40–4.50)

## 2020-04-27 ENCOUNTER — Other Ambulatory Visit: Payer: Self-pay | Admitting: Adult Health

## 2020-04-27 DIAGNOSIS — E785 Hyperlipidemia, unspecified: Secondary | ICD-10-CM

## 2020-08-10 ENCOUNTER — Encounter: Payer: 59 | Admitting: Adult Health

## 2020-08-10 NOTE — Progress Notes (Signed)
Complete Physical  Assessment and Plan:  Stuart Newman was seen today for annual exam.  Diagnoses and all orders for this visit:  Encounter for routine adult health examination without abnormal findings  Labile hypertension Recently at goal without medications Monitor blood pressure at home/work; call if consistently over 130/80 Discussed DASH diet.   Reminder to go to the ER if any CP, SOB, nausea, dizziness, severe HA, changes vision/speech, left arm numbness and tingling and jaw pain. - magnesium - CMP/GFR - EKG  Hyperlipidemia, unspecified hyperlipidemia type On statin for LDL goal <100; titrate for goal  Continue low cholesterol diet and exercise, weight loss encouraged Check lipid panel. -     Lipid panel -     TSH  Obesity (BMI 30.0-34.9) Long discussion about weight loss, diet, and exercise Recommended diet heavy in fruits and veggies and low in animal meats, cheeses, and dairy products, appropriate calorie intake Discussed appropriate weight for height, goal of <250 lb set for next visit, he feels this is achievable; discussed high fiber diet Follow up at next visit -     Hemoglobin A1c  Medication management -     CBC with Differential/Platelet -     COMPLETE METABOLIC PANEL WITH GFR -     Magnesium -     Urinalysis w microscopic + reflex cultur  Screening for hematuria or proteinuria -     Urinalysis w microscopic + reflex cultur  Screening for diabetes mellitus -     Hemoglobin A1c  Vitamin D deficiency Discussed goal of 70, benefits Get on supplement, double up if forgetting to take Defer check today per patient request -     VITAMIN D 25 Hydroxy (Vit-D Deficiency, Fractures)  Abnormal EKG Inferior lead Q waves, T wave inversion lead 3 new from previous Persistent on recheck EKG He is very active, declines concerning cardiac sx Has been getting EKGs q2y at work due to [employer]; after discussion will request to compare for trend Defer on cardiology  referral today per his request Go to the ER if any chest pain, shortness of breath, nausea, dizziness, severe HA, changes vision/speech   Orders Placed This Encounter  Procedures   CBC with Differential/Platelet   COMPLETE METABOLIC PANEL WITH GFR   Magnesium   Lipid panel   TSH   Hemoglobin A1c   Vitamin B12   Urinalysis, Routine w reflex microscopic   EKG 12-Lead   Discussed med's effects and SE's. Screening labs and tests as requested with regular follow-up as recommended. Over 40 minutes of exam, counseling, chart review and critical decision making was performed  Future Appointments  Date Time Provider Department Center  08/15/2021 10:00 AM Judd Gaudier, NP [facility] None    HPI  This very nice 38 y.o.male presents for complete physical. He has Labile hypertension; Obesity (BMI 30.0-34.9); Vitamin D deficiency; and Hyperlipidemia on their problem list.   Patient reports no complaints at this time.   He is married, 1 child - boy, 22 years old and doing well. 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   BMI is Body mass index is 33.97 kg/m., he has not been working on diet, works physically active job, has access to gym but hasn't been using.  Admits diet has been pretty bad - eating out too much due to work, trying to keep tuna and crackers at his work.  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 He denies chest pain, shortness of breath, dizziness.    He is on cholesterol medication (rosuvastatin 5 mg daily, up from 3  days/week) and denies myalgias. His cholesterol is not at goal. The cholesterol last visit was:   Lab Results  Component Value Date   CHOL 171 02/11/2020   HDL 36 (L) 02/11/2020   LDLCALC 107 (H) 02/11/2020   TRIG 164 (H) 02/11/2020   CHOLHDL 4.8 02/11/2020   Last Z6X in the office was:  Lab Results  Component Value Date   HGBA1C 5.3 08/11/2019   Lab Results  Component Value Date   GFRNONAA 102 02/11/2020   Patient is not consistently on Vitamin D supplement, takes 2000 IU    Lab Results  Component Value Date   VD25OH 39 08/11/2019      Lab Results  Component Value Date   VITAMINB12 386 07/17/2017     Current Medications:  Current Outpatient Medications on File Prior to Visit  Medication Sig Dispense Refill   CHOLECALCIFEROL PO Take 2,000 Units by mouth daily.     rosuvastatin (CRESTOR) 5 MG tablet TAKE 1 TABLET BY MOUTH DAILY FOR CHOLESTEROL 90 tablet 1   No current facility-administered medications on file prior to visit.   Health Maintenance:   Immunization History  Administered Date(s) Administered   Tdap 07/17/2017    TD/TDAP: 2018 Influenza: declines  Pneumovax: -  Covid 19: declines   CXR: 2013 RUQ [place]: 2017   Colonoscopy: due age 66 EGD: n/a  Vision: no issues, last remote, 20/20 vision at work physical Dental: Dr. Autumn Patty, last visit 2021, goes q20m Derm: Dr. ?, had full screen 2019  Allergies:  Allergies  Allergen Reactions   Penicillins Hives, Shortness Of Breath and Swelling   Medical History:  has Labile hypertension; Obesity (BMI 30.0-34.9); Vitamin D deficiency; and Hyperlipidemia on their problem list. Surgical History:  He  has a past surgical history that includes Skin graft. Family History:  Hisfamily history includes Hyperlipidemia in his father; Hypertension in his brother, father, and mother; Pancreatic cancer in his maternal grandfather and maternal grandmother. Social History:   reports that he has never smoked. He  has never used smokeless tobacco. He reports current alcohol use of about 1.0 standard drink of alcohol per week. He reports that he does not use drugs.  Review of Systems: Review of Systems  Constitutional: Negative.  Negative for malaise/fatigue and weight loss.  HENT: Negative.  Negative for hearing loss and tinnitus.   Eyes: Negative.  Negative for blurred vision and double vision.  Respiratory: Negative.  Negative for cough, sputum production, shortness of breath and wheezing.   Cardiovascular: Negative.  Negative for chest pain, palpitations, orthopnea, claudication, leg swelling and PND.  Gastrointestinal: Negative.  Negative for abdominal pain, blood in stool, constipation, diarrhea, heartburn, melena, nausea and vomiting.  Genitourinary: Negative.   Musculoskeletal: Negative.  Negative for falls, joint pain and myalgias.  Skin: Negative.  Negative for rash.  Neurological: Negative for dizziness, tingling, sensory change, weakness and headaches.  Endo/Heme/Allergies: Negative for polydipsia.  Psychiatric/Behavioral: Negative.  Negative  for depression, memory loss, substance abuse and suicidal ideas. The patient is not nervous/anxious and does not have insomnia.   All other systems reviewed and are negative.   Physical Exam: Estimated body mass index is 33.97 kg/m as calculated from the following:   Height as of this encounter: 6\' 3"  (1.905 m).   Weight as of this encounter: 271 lb 12.8 oz (123.3 kg). BP 122/82    Pulse 84    Temp 97.7 F (36.5 C)    Ht 6\' 3"  (1.905 m)    Wt 271 lb 12.8 oz (123.3 kg)    SpO2 98%    BMI 33.97 kg/m  General Appearance: Well nourished, in no apparent distress.  Eyes: PERRLA, EOMs, conjunctiva no swelling or erythema  Sinuses: No Frontal/maxillary tenderness  ENT/Mouth: Ext aud canals clear, normal light reflex with TMs without erythema, bulging. Good dentition. No erythema, swelling, or exudate on post pharynx. Tonsils not swollen or erythematous.  Hearing normal.  Neck: Supple, thyroid normal. No bruits  Respiratory: Respiratory effort normal, BS equal bilaterally without rales, rhonchi, wheezing or stridor.  Cardio: RRR without murmurs, rubs or gallops. Brisk peripheral pulses without edema.  Chest: symmetric, with normal excursions and percussion.  Abdomen: Soft, nontender, no guarding, rebound, hernias, masses, or organomegaly.  Lymphatics: Non tender without lymphadenopathy.  Genitourinary: declines, doing regular self exams, no concerns Musculoskeletal: Full ROM all peripheral extremities, 5/5 strength, and normal gait.  Skin: Warm, dry without rashes, lesions, ecchymosis. Has well healed skin graft scar to upper abdomen/chest.  Neuro: Cranial nerves intact, reflexes equal bilaterally. Normal muscle tone, no cerebellar symptoms. Sensation intact.  Psych: Awake and oriented X 3, normal affect, Insight and Judgment appropriate.   EKG: EKG with new inferior q waves, with T wave inversion III; otherwise sinus rhythm; persistent on repeat EKG; very active, denies any concerning cardiac sx He has been getting EKGs q2y via work physical; will request to compare per his request prior to referral to cardiology  10:16 AM [facility]

## 2020-08-11 ENCOUNTER — Other Ambulatory Visit: Payer: Self-pay

## 2020-08-11 ENCOUNTER — Ambulatory Visit: Payer: 59 | Admitting: Adult Health

## 2020-08-11 ENCOUNTER — Encounter: Payer: Self-pay | Admitting: Adult Health

## 2020-08-11 VITALS — BP 122/82 | HR 84 | Temp 97.7°F | Ht 75.0 in | Wt 271.8 lb

## 2020-08-11 DIAGNOSIS — E559 Vitamin D deficiency, unspecified: Secondary | ICD-10-CM

## 2020-08-11 DIAGNOSIS — R0989 Other specified symptoms and signs involving the circulatory and respiratory systems: Secondary | ICD-10-CM

## 2020-08-11 DIAGNOSIS — E66811 Obesity, class 1: Secondary | ICD-10-CM

## 2020-08-11 DIAGNOSIS — Z8249 Family history of ischemic heart disease and other diseases of the circulatory system: Secondary | ICD-10-CM

## 2020-08-11 DIAGNOSIS — Z0000 Encounter for general adult medical examination without abnormal findings: Secondary | ICD-10-CM | POA: Diagnosis not present

## 2020-08-11 DIAGNOSIS — Z136 Encounter for screening for cardiovascular disorders: Secondary | ICD-10-CM

## 2020-08-11 DIAGNOSIS — Z1329 Encounter for screening for other suspected endocrine disorder: Secondary | ICD-10-CM

## 2020-08-11 DIAGNOSIS — E669 Obesity, unspecified: Secondary | ICD-10-CM

## 2020-08-11 DIAGNOSIS — R9431 Abnormal electrocardiogram [ECG] [EKG]: Secondary | ICD-10-CM | POA: Insufficient documentation

## 2020-08-11 DIAGNOSIS — E538 Deficiency of other specified B group vitamins: Secondary | ICD-10-CM

## 2020-08-11 DIAGNOSIS — Z131 Encounter for screening for diabetes mellitus: Secondary | ICD-10-CM

## 2020-08-11 DIAGNOSIS — E782 Mixed hyperlipidemia: Secondary | ICD-10-CM

## 2020-08-11 NOTE — Patient Instructions (Addendum)
Stuart Newman , Thank you for taking time to come for your Annual Wellness Visit. I appreciate your ongoing commitment to your health goals. Please review the following plan we discussed and let me know if I can assist you in the future.   These are the goals we discussed: Goals    . Blood Pressure < 120/80    . DIET - EAT MORE FRUITS AND VEGETABLES     Aim for 7+ servings of a variety of fresh fruits and veggies (1/2 cup each) daily     . LDL CALC < 100    . Weight (lb) < 250 lb (113.4 kg)       This is a list of the screening recommended for you and due dates:  Health Maintenance  Topic Date Due  . COVID-19 Vaccine (1) 08/27/2020*  . Flu Shot  01/27/2021*  . Tetanus Vaccine  07/18/2027  .  Hepatitis C: One time screening is recommended by Center for Disease Control  (CDC) for  adults born from [birth year] through 1966.   Completed  . HIV Screening  Completed  *Topic was postponed. The date shown is not the original due date.       Know what a healthy weight is for you (roughly BMI <25) and aim to maintain this  Aim for 7+ servings of fruits and vegetables daily  65-80+ fluid ounces of water or unsweet tea for healthy kidneys  Limit to max 1 drink of alcohol per day; avoid smoking/tobacco  Limit animal fats in diet for cholesterol and heart health - choose grass fed whenever available  Avoid highly processed foods, and foods high in saturated/trans fats  Aim for low stress - take time to unwind and care for your mental health  Aim for 150 min of moderate intensity exercise weekly for heart health, and weights twice weekly for bone health  Aim for 7-9 hours of sleep daily    Highest fiber foods - beans, chia seeds, whole grains, veggies  Beans are best - aim for 1/2 cup x 4+ servings per week    High-Fiber Diet Fiber, also called dietary fiber, is a type of carbohydrate that is found in fruits, vegetables, whole grains, and beans. A high-fiber diet can have many health  benefits. Your health care provider may recommend a high-fiber diet to help:  Prevent constipation. Fiber can make your bowel movements more regular.  Lower your cholesterol.  Relieve the following conditions: ? Swelling of veins in the anus (hemorrhoids). ? Swelling and irritation (inflammation) of specific areas of the digestive tract (uncomplicated diverticulosis). ? A problem of the large intestine (colon) that sometimes causes pain and diarrhea (irritable bowel syndrome, IBS).  Prevent overeating as part of a weight-loss plan.  Prevent heart disease, type 2 diabetes, and certain cancers. What is my plan? The recommended daily fiber intake in grams (g) includes:  38 g for men age 65 or younger.  30 g for men over age 40.  25 g for women age 66 or younger.  21 g for women over age 83. You can get the recommended daily intake of dietary fiber by:  Eating a variety of fruits, vegetables, grains, and beans.  Taking a fiber supplement, if it is not possible to get enough fiber through your diet. What do I need to know about a high-fiber diet?  It is better to get fiber through food sources rather than from fiber supplements. There is not a lot of research about how  effective supplements are.  Always check the fiber content on the nutrition facts label of any prepackaged food. Look for foods that contain 5 g of fiber or more per serving.  Talk with a diet and nutrition specialist (dietitian) if you have questions about specific foods that are recommended or not recommended for your medical condition, especially if those foods are not listed below.  Gradually increase how much fiber you consume. If you increase your intake of dietary fiber too quickly, you may have bloating, cramping, or gas.  Drink plenty of water. Water helps you to digest fiber. What are tips for following this plan?  Eat a wide variety of high-fiber foods.  Make sure that half of the grains that you eat  each day are whole grains.  Eat breads and cereals that are made with whole-grain flour instead of refined flour or white flour.  Eat brown rice, bulgur wheat, or millet instead of white rice.  Start the day with a breakfast that is high in fiber, such as a cereal that contains 5 g of fiber or more per serving.  Use beans in place of meat in soups, salads, and pasta dishes.  Eat high-fiber snacks, such as berries, raw vegetables, nuts, and popcorn.  Choose whole fruits and vegetables instead of processed forms like juice or sauce. What foods can I eat?  Fruits Berries. Pears. Apples. Oranges. Avocado. Prunes and raisins. Dried figs. Vegetables Sweet potatoes. Spinach. Kale. Artichokes. Cabbage. Broccoli. Cauliflower. Green peas. Carrots. Squash. Grains Whole-grain breads. Multigrain cereal. Oats and oatmeal. Brown rice. Barley. Bulgur wheat. Millet. Quinoa. Bran muffins. Popcorn. Rye wafer crackers. Meats and other proteins Navy, kidney, and pinto beans. Soybeans. Split peas. Lentils. Nuts and seeds. Dairy Fiber-fortified yogurt. Beverages Fiber-fortified soy milk. Fiber-fortified orange juice. Other foods Fiber bars. The items listed above may not be a complete list of recommended foods and beverages. Contact a dietitian for more options. What foods are not recommended? Fruits Fruit juice. Cooked, strained fruit. Vegetables Fried potatoes. Canned vegetables. Well-cooked vegetables. Grains White bread. Pasta made with refined flour. White rice. Meats and other proteins Fatty cuts of meat. Fried chicken or fried fish. Dairy Milk. Yogurt. Cream cheese. Sour cream. Fats and oils Butters. Beverages Soft drinks. Other foods Cakes and pastries. The items listed above may not be a complete list of foods and beverages to avoid. Contact a dietitian for more information. Summary  Fiber is a type of carbohydrate. It is found in fruits, vegetables, whole grains, and  beans.  There are many health benefits of eating a high-fiber diet, such as preventing constipation, lowering blood cholesterol, helping with weight loss, and reducing your risk of heart disease, diabetes, and certain cancers.  Gradually increase your intake of fiber. Increasing too fast can result in cramping, bloating, and gas. Drink plenty of water while you increase your fiber.  The best sources of fiber include whole fruits and vegetables, whole grains, nuts, seeds, and beans. This information is not intended to replace advice given to you by your health care provider. Make sure you discuss any questions you have with your health care provider. Document Revised: 08/20/2017 Document Reviewed: 08/20/2017 Elsevier Patient Education  2020 [employer].

## 2020-08-12 LAB — COMPLETE METABOLIC PANEL WITH GFR
AG Ratio: 1.8 (calc) (ref 1.0–2.5)
ALT: 19 U/L (ref 9–46)
AST: 16 U/L (ref 10–40)
Albumin: 4.6 g/dL (ref 3.6–5.1)
Alkaline phosphatase (APISO): 94 U/L (ref 36–130)
BUN: 13 mg/dL (ref 7–25)
CO2: 28 mmol/L (ref 20–32)
Calcium: 9.6 mg/dL (ref 8.6–10.3)
Chloride: 106 mmol/L (ref 98–110)
Creat: 1.09 mg/dL (ref 0.60–1.35)
GFR, Est African American: 99 mL/min/{1.73_m2} (ref >=60)
GFR, Est Non African American: 85 mL/min/{1.73_m2} (ref >=60)
Globulin: 2.5 g/dL (calc) (ref 1.9–3.7)
Glucose, Bld: 94 mg/dL (ref 65–99)
Potassium: 4.2 mmol/L (ref 3.5–5.3)
Sodium: 141 mmol/L (ref 135–146)
Total Bilirubin: 0.4 mg/dL (ref 0.2–1.2)
Total Protein: 7.1 g/dL (ref 6.1–8.1)

## 2020-08-12 LAB — URINALYSIS, ROUTINE W REFLEX MICROSCOPIC
Bilirubin Urine: NEGATIVE
Glucose, UA: NEGATIVE
Hgb urine dipstick: NEGATIVE
Ketones, ur: NEGATIVE
Leukocytes,Ua: NEGATIVE
Nitrite: NEGATIVE
Protein, ur: NEGATIVE
Specific Gravity, Urine: 1.005 (ref 1.001–1.03)
pH: 6 (ref 5.0–8.0)

## 2020-08-12 LAB — LIPID PANEL
Cholesterol: 161 mg/dL (ref <200)
HDL: 38 mg/dL — ABNORMAL LOW (ref >=40)
LDL Cholesterol (Calc): 99 mg/dL (calc)
Non-HDL Cholesterol (Calc): 123 mg/dL (calc) (ref <130)
Total CHOL/HDL Ratio: 4.2 (calc) (ref <5.0)
Triglycerides: 143 mg/dL (ref <150)

## 2020-08-12 LAB — TSH: TSH: 1.48 mIU/L (ref 0.40–4.50)

## 2020-08-12 LAB — HEMOGLOBIN A1C
Hgb A1c MFr Bld: 5.4 % of total Hgb (ref <5.7)
Mean Plasma Glucose: 108 (calc)
eAG (mmol/L): 6 (calc)

## 2020-08-12 LAB — CBC WITH DIFFERENTIAL/PLATELET
Absolute Monocytes: 599 cells/uL (ref 200–950)
Basophils Absolute: 51 cells/uL (ref 0–200)
Basophils Relative: 0.9 %
Eosinophils Absolute: 171 cells/uL (ref 15–500)
Eosinophils Relative: 3 %
HCT: 45.2 % (ref 38.5–50.0)
Hemoglobin: 14.8 g/dL (ref 13.2–17.1)
Lymphs Abs: 2086 cells/uL (ref 850–3900)
MCH: 29.8 pg (ref 27.0–33.0)
MCHC: 32.7 g/dL (ref 32.0–36.0)
MCV: 90.9 fL (ref 80.0–100.0)
MPV: 11 fL (ref 7.5–12.5)
Monocytes Relative: 10.5 %
Neutro Abs: 2793 cells/uL (ref 1500–7800)
Neutrophils Relative %: 49 %
Platelets: 265 10*3/uL (ref 140–400)
RBC: 4.97 10*6/uL (ref 4.20–5.80)
RDW: 12.9 % (ref 11.0–15.0)
Total Lymphocyte: 36.6 %
WBC: 5.7 10*3/uL (ref 3.8–10.8)

## 2020-08-12 LAB — VITAMIN B12: Vitamin B-12: 479 pg/mL (ref 200–1100)

## 2020-08-12 LAB — MAGNESIUM: Magnesium: 2.1 mg/dL (ref 1.5–2.5)

## 2020-10-06 ENCOUNTER — Other Ambulatory Visit: Payer: Self-pay | Admitting: Adult Health

## 2020-10-06 DIAGNOSIS — E785 Hyperlipidemia, unspecified: Secondary | ICD-10-CM

## 2021-02-09 NOTE — Progress Notes (Signed)
FOLLOW UP  Assessment and Plan:   History of elevated BP without dx of htn Fairly controlled at this time without medications Monitor blood pressure at home; patient to call if consistently greater than 130/80 Continue DASH diet, weight loss encourage.   Reminder to go to the ER if any CP, SOB, nausea, dizziness, severe HA, changes vision/speech, left arm numbness and tingling and jaw pain.  Cholesterol On rosuvastatin with improved values, tolerating without issues Continue low cholesterol diet and exercise.  Check lipid panel.   Obesity Long discussion about weight loss, diet, and exercise Recommended diet heavy in fruits and veggies and low in animal meats, cheeses, and dairy products, appropriate calorie intake Discussed ideal weight for height  and initial weight goal (<250 lb) Discussed reducing portions, focus on increasing fresh fruits/veggies/whole grains/bean Will follow up in 6 months  Vitamin D Def Below goal at last visit; unsure how much taking, ? 2000 IU continue supplementation to maintain goal of 60-100 Check Vit D level, clarify current dose  Continue diet and meds as discussed. Further disposition pending results of labs. Discussed med's effects and SE's.   Over 30 minutes of exam, counseling, chart review, and critical decision making was performed.   Future Appointments  Date Time Provider Department Center  08/15/2021 10:00 AM Judd Gaudier, NP [facility] None    ----------------------------------------------------------------------------------------------------------------------  HPI 40 y.o. male  presents for 6 month follow up on labile BP, cholesterol, obesity and vitamin D deficiency.   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  Wt Readings from Last 3 Encounters:  02/10/21 277 lb (125.6 kg)  08/11/20 271 lb 12.8 oz (123.3 kg)  02/11/20 260 lb (117.9 kg)   His blood pressure has been controlled at home (120/70s), today their BP is BP: 134/86  He does workout. He denies chest pain, shortness of breath, dizziness.   He is on cholesterol medication, taking rosuvastatin 5 mg daily. His cholesterol is at goal. The cholesterol last visit was:   Lab Results  Component Value Date   CHOL 161 08/11/2020   HDL 38 (L) 08/11/2020   LDLCALC 99 08/11/2020   TRIG 143 08/11/2020   CHOLHDL 4.2 08/11/2020    He has been working on diet and exercise for glucose management, and denies increased appetite, nausea, paresthesia of the feet, polydipsia, polyuria and visual disturbances. Last A1C in the office was:  Lab Results  Component Value Date   HGBA1C 5.4 08/11/2020   Patient is on Vitamin D supplement, taking ? 2000 IU   Lab Results  Component Value Date   VD25OH 39 08/11/2019       Current Medications:  Current Outpatient Medications on File Prior to Visit  Medication Sig  . CHOLECALCIFEROL PO Take 2,000 Units by mouth daily.  . rosuvastatin (CRESTOR) 5 MG tablet TAKE 1 TABLET BY MOUTH DAILY FOR CHOLESTEROL   No current facility-administered medications on file prior to visit.     Allergies:  Allergies  Allergen Reactions  . Penicillins Hives, Shortness Of Breath and Swelling     Medical History:  Past Medical History:  Diagnosis Date  . Hyperlipidemia 07/18/2017  .  Labile hypertension    Family history- Reviewed and unchanged Social history- Reviewed and unchanged   Review of Systems:  Review of Systems  Constitutional: Negative for malaise/fatigue and weight loss.  HENT: Negative for hearing loss and tinnitus.   Eyes: Negative for blurred vision and double vision.  Respiratory: Negative for cough, shortness of  breath and wheezing.   Cardiovascular: Negative for chest pain, palpitations, orthopnea, claudication and leg swelling.  Gastrointestinal: Negative for abdominal pain, blood in stool, constipation, diarrhea, heartburn, melena, nausea and vomiting.  Genitourinary: Negative.   Musculoskeletal: Negative for joint pain and myalgias.  Skin: Negative for rash.  Neurological: Negative for dizziness, tingling, sensory change, weakness and headaches.  Endo/Heme/Allergies: Negative for polydipsia.  Psychiatric/Behavioral: Negative.   All other systems reviewed and are negative.     Physical Exam: BP 134/86   Pulse 76   Temp (!) 97.2 F (36.2 C)   Wt 277 lb (125.6 kg)   SpO2 98%   BMI 34.62 kg/m  Wt Readings from Last 3 Encounters:  02/10/21 277 lb (125.6 kg)  08/11/20 271 lb 12.8 oz (123.3 kg)  02/11/20 260 lb (117.9 kg)   General Appearance: Well nourished, in no apparent distress. Eyes: PERRLA, EOMs, conjunctiva no swelling or erythema Sinuses: No Frontal/maxillary tenderness ENT/Mouth: Ext aud canals clear, TMs without erythema, bulging. No erythema, swelling, or exudate on post pharynx.  Tonsils not swollen or erythematous. Hearing normal.  Neck: Supple, thyroid normal.  Respiratory: Respiratory effort normal, BS equal bilaterally without rales, rhonchi, wheezing or stridor.  Cardio: RRR with no MRGs. Brisk peripheral pulses without edema.  Abdomen: Soft, + BS.  Non tender, no guarding, rebound, hernias, masses. Lymphatics: Non tender without lymphadenopathy.  Musculoskeletal: Full ROM, 5/5 strength, Normal gait Skin: Warm, dry without rashes, lesions, ecchymosis.  Neuro: Cranial nerves intact. No cerebellar symptoms.  Psych: Awake and oriented X 3, normal affect, Insight and Judgment appropriate.    Dan Maker, NP 9:44 AM [facility]

## 2021-02-10 ENCOUNTER — Other Ambulatory Visit: Payer: Self-pay

## 2021-02-10 ENCOUNTER — Ambulatory Visit: Payer: 59 | Admitting: Adult Health

## 2021-02-10 ENCOUNTER — Encounter: Payer: Self-pay | Admitting: Adult Health

## 2021-02-10 VITALS — BP 134/86 | HR 76 | Temp 97.2°F | Wt 277.0 lb

## 2021-02-10 DIAGNOSIS — E782 Mixed hyperlipidemia: Secondary | ICD-10-CM

## 2021-02-10 DIAGNOSIS — E559 Vitamin D deficiency, unspecified: Secondary | ICD-10-CM

## 2021-02-10 DIAGNOSIS — Z79899 Other long term (current) drug therapy: Secondary | ICD-10-CM

## 2021-02-10 DIAGNOSIS — E669 Obesity, unspecified: Secondary | ICD-10-CM

## 2021-02-10 DIAGNOSIS — R0989 Other specified symptoms and signs involving the circulatory and respiratory systems: Secondary | ICD-10-CM

## 2021-02-10 NOTE — Patient Instructions (Signed)
Goals    . Blood Pressure < 120/80    . DIET - EAT MORE FRUITS AND VEGETABLES     Aim for 7+ servings of a variety of fresh fruits and veggies (1/2 cup each) daily     . LDL CALC < 100    . Weight (lb) < 250 lb (113.4 kg)     Weight once weekly, aim for 0.5-2 lb weight loss per week Try to reduce processed food, increase whole foods, high fiber        A great goal to work towards is aiming to get in a serving daily of some of the most nutritionally dense foods - G- BOMBS daily

## 2021-02-11 LAB — COMPLETE METABOLIC PANEL WITH GFR
AG Ratio: 1.8 (calc) (ref 1.0–2.5)
ALT: 21 U/L (ref 9–46)
AST: 16 U/L (ref 10–40)
Albumin: 4.6 g/dL (ref 3.6–5.1)
Alkaline phosphatase (APISO): 98 U/L (ref 36–130)
BUN: 14 mg/dL (ref 7–25)
CO2: 27 mmol/L (ref 20–32)
Calcium: 9.5 mg/dL (ref 8.6–10.3)
Chloride: 107 mmol/L (ref 98–110)
Creat: 1.07 mg/dL (ref 0.60–1.35)
GFR, Est African American: 100 mL/min/{1.73_m2} (ref >=60)
GFR, Est Non African American: 86 mL/min/{1.73_m2} (ref >=60)
Globulin: 2.6 g/dL (calc) (ref 1.9–3.7)
Glucose, Bld: 98 mg/dL (ref 65–99)
Potassium: 4.5 mmol/L (ref 3.5–5.3)
Sodium: 141 mmol/L (ref 135–146)
Total Bilirubin: 0.3 mg/dL (ref 0.2–1.2)
Total Protein: 7.2 g/dL (ref 6.1–8.1)

## 2021-02-11 LAB — CBC WITH DIFFERENTIAL/PLATELET
Absolute Monocytes: 657 cells/uL (ref 200–950)
Basophils Absolute: 68 cells/uL (ref 0–200)
Basophils Relative: 1.1 %
Eosinophils Absolute: 130 cells/uL (ref 15–500)
Eosinophils Relative: 2.1 %
HCT: 43.4 % (ref 38.5–50.0)
Hemoglobin: 14.4 g/dL (ref 13.2–17.1)
Lymphs Abs: 2158 cells/uL (ref 850–3900)
MCH: 30.3 pg (ref 27.0–33.0)
MCHC: 33.2 g/dL (ref 32.0–36.0)
MCV: 91.2 fL (ref 80.0–100.0)
MPV: 10.9 fL (ref 7.5–12.5)
Monocytes Relative: 10.6 %
Neutro Abs: 3187 cells/uL (ref 1500–7800)
Neutrophils Relative %: 51.4 %
Platelets: 272 10*3/uL (ref 140–400)
RBC: 4.76 10*6/uL (ref 4.20–5.80)
RDW: 12.8 % (ref 11.0–15.0)
Total Lymphocyte: 34.8 %
WBC: 6.2 10*3/uL (ref 3.8–10.8)

## 2021-02-11 LAB — LIPID PANEL
Cholesterol: 166 mg/dL (ref <200)
HDL: 36 mg/dL — ABNORMAL LOW (ref >=40)
LDL Cholesterol (Calc): 109 mg/dL (calc) — ABNORMAL HIGH
Non-HDL Cholesterol (Calc): 130 mg/dL (calc) — ABNORMAL HIGH (ref <130)
Total CHOL/HDL Ratio: 4.6 (calc) (ref <5.0)
Triglycerides: 103 mg/dL (ref <150)

## 2021-02-11 LAB — VITAMIN D 25 HYDROXY (VIT D DEFICIENCY, FRACTURES): Vit D, 25-Hydroxy: 29 ng/mL — ABNORMAL LOW (ref 30–100)

## 2021-02-11 LAB — TSH: TSH: 1.49 mIU/L (ref 0.40–4.50)

## 2021-04-04 ENCOUNTER — Other Ambulatory Visit: Payer: Self-pay | Admitting: Adult Health Nurse Practitioner

## 2021-04-04 DIAGNOSIS — E785 Hyperlipidemia, unspecified: Secondary | ICD-10-CM

## 2021-05-05 ENCOUNTER — Telehealth: Payer: Self-pay | Admitting: Genetic Counselor

## 2021-05-05 NOTE — Telephone Encounter (Addendum)
Called to set up genetic counseling appt with Stuart Newman due to his mother's positive genetics result.  Scheduled for 7/26 at 9am with [facility].  May call to change to virtual visit if has trouble arranging childcare.  Asked about costs associated with genetic test.  Discussed there may be cost associated with appointment.  Discussed that no charge testing only applies for family variant testing within 90 days of initial report and sometimes more comprehensive testing is recommended based on family history collected during appt.

## 2021-05-20 ENCOUNTER — Telehealth: Payer: Self-pay | Admitting: Genetic Counselor

## 2021-05-20 NOTE — Telephone Encounter (Signed)
Stuart Newman called back to switch Tuesday's appointment to virtual.

## 2021-05-20 NOTE — Telephone Encounter (Signed)
LVM that our current policy is that children under the age of 41 are not allowed in the Cancer Center. Requested that he call back to change his genetic counseling appointment to virtual, or to discuss rescheduling an in person visit if that is preferable.

## 2021-05-20 NOTE — Telephone Encounter (Signed)
Patient called to let us know that he was not able to secure childcare for his upcoming genetic counseling appointment. He asked if he is able to bring his 40-year-old child to the appointment. We will find out and call back if we need to switch the appointment to virtual.

## 2021-05-24 ENCOUNTER — Other Ambulatory Visit: Payer: Self-pay

## 2021-05-24 ENCOUNTER — Inpatient Hospital Stay: Payer: 59 | Attending: Genetic Counselor | Admitting: Genetic Counselor

## 2021-05-24 ENCOUNTER — Encounter: Payer: Self-pay | Admitting: Genetic Counselor

## 2021-05-24 DIAGNOSIS — Z800 Family history of malignant neoplasm of digestive organs: Secondary | ICD-10-CM

## 2021-05-24 DIAGNOSIS — Z8481 Family history of carrier of genetic disease: Secondary | ICD-10-CM | POA: Insufficient documentation

## 2021-05-24 DIAGNOSIS — Z808 Family history of malignant neoplasm of other organs or systems: Secondary | ICD-10-CM

## 2021-05-24 DIAGNOSIS — Z803 Family history of malignant neoplasm of breast: Secondary | ICD-10-CM

## 2021-05-24 NOTE — Progress Notes (Signed)
REFERRING PROVIDER: Unk Pinto, MD [address] [place],  [place] [address]  PRIMARY PROVIDER:  Unk Pinto, MD  PRIMARY REASON FOR VISIT:  1. Family history of gene mutation   2. Family history of breast cancer   3. Family history of colon cancer   4. Family history of peritoneal cancer   5. Family history of pancreatic cancer      I connected with Mr. Fancher on 05/24/2021 at 9:00 am EDT by video conference and verified that I am speaking with the correct person using two identifiers.   Patient location: Home Provider location: [facility]  HISTORY OF PRESENT ILLNESS:   Mr. Ledvina, a 40 y.o. male, was seen for a [facility] cancer genetics consultation due to a family history of a known CHEK2 mutation and cancer.  Mr. Buer presents to clinic today to discuss the possibility of a hereditary predisposition to cancer, genetic testing, and to further clarify his future cancer risks, as well as potential cancer risks for family members.   Mr. Satterly does not have a personal history of cancer.     Past Medical History:  Diagnosis Date   Family history of breast cancer    Family history of colon cancer    Family history of gene mutation    Family history of pancreatic cancer    Family history of peritoneal cancer    Hyperlipidemia 07/18/2017   Labile hypertension     Past Surgical History:  Procedure Laterality Date   SKIN GRAFT     remotely as a child- left thigh to chest    Social History   Socioeconomic History   Marital status: Married    Spouse name: Not on file   Number of children: 1   Years of education: Not on file   Highest education level: Not on file  Occupational History   Not on file  Tobacco Use   Smoking status: Never   Smokeless tobacco: Never  Vaping Use   Vaping Use: Never used  Substance and Sexual Activity   Alcohol use: Yes    Alcohol/week: 1.0 standard drink    Types: 1 Standard drinks or equivalent per  week    Comment: 0-3 per average week    Drug use: No   Sexual activity: Yes    Partners: Female    Birth control/protection: None  Other Topics Concern   Not on file  Social History Narrative   Not on file   Social Determinants of Health   Financial Resource Strain: Not on file  Food Insecurity: Not on file  Transportation Needs: Not on file  Physical Activity: Not on file  Stress: Not on file  Social Connections: Not on file     FAMILY HISTORY:  We obtained a detailed, 4-generation family history.  Significant diagnoses are listed below: Family History  Problem Relation Age of Onset   Hypertension Mother    Breast cancer Mother    Other Mother        CHEK2 gene mutation p.R95* (c.283C>T)   Hypertension Father    Hyperlipidemia Father    Hypertension Brother    Pancreatic cancer Maternal Grandmother        dx >50   Breast cancer Maternal Grandmother        dx >50   Pancreatic cancer Maternal Grandfather        dx >50   Cancer Paternal Grandmother        peritoneal cancer, dx 81s  Colon cancer Maternal Great-grandmother        MGM's mother   Mr. Belmares has one son (age 67). He has one brother (age 62). Neither of these relatives have had cancer.  Mr. Parthasarathy mother is alive at age 73 and was recently diagnosed with breast cancer and tested positive for a CHEK2 gene mutation called p.R95* (c.283C>T). There are no maternal aunts/uncles. Mr. Foerster maternal grandmother died older than 97 and had breast cancer and pancreatic cancer. His maternal grandfather died older than 47 with pancreatic cancer. A maternal great-grandmother (MGM's mother) had colon cancer.  Mr. Gahm father is alive at age 3 without cancer. There is one paternal aunt, although Mr. Dagostino has limited information about her. Mr. Weckerly paternal grandmother died in her 29s with peritoneal cancer. He does not have much information about his paternal grandfather.   Mr. Allston is aware of previous family history  of genetic testing for hereditary cancer risks in his mother. Patient's ancestors are of unknown descent. There is no reported Ashkenazi Jewish ancestry. There is no known consanguinity.  GENETIC COUNSELING ASSESSMENT: Mr. Streeper is a 40 y.o. male with a family history of a known CHEK2 gene mutation and cancer. We, therefore, discussed and recommended the following at today's visit.   DISCUSSION:  We discussed that Mr. Sedano has a 50% (1 in 2) chance to also have the CHEK2 variant that was discovered in his mother. We reviewed the cancer risks that are associated with CHEK2 mutations, including an increased risk of breast, colon, and prostate cancers. These estimated cancer risks vary widely and may be influenced by family history. Individuals with a CHEK2 mutation may opt for increased cancer screening for associated cancer risks per the NCCN guidelines.  We discussed that testing is beneficial for several reasons, including knowing about other cancer risks, identifying potential screening and risk-reduction options that may be appropriate, and to understand if other family members could be at risk for cancer and allow them to undergo genetic testing. We reviewed the characteristics, features and inheritance patterns of hereditary cancer syndromes. We also discussed genetic testing, including the appropriate family members to test, the process of testing, insurance coverage, genetic discrimination, and turn-around-time for results. We discussed the implications of a negative, positive and/or variant of uncertain significant result.  We recommended Mr. Bostock pursue genetic testing for the known familial variant in CHEK2, called p.R95* (c.283C>T), through [facility]. Because Mr. Ramirez mother had genetic testing that identified a pathogenic variant through [facility], testing for the same mutation will be free of charge if carried out through the same genetic testing laboratory within 90 days of the original  report date (04/06/2021).   We discussed that some people do not want to undergo genetic testing due to fear of genetic discrimination. A federal law called the Genetic Information Non-Discrimination Act (GINA) of 2008 helps protect individuals against genetic discrimination based on their genetic test results. It impacts both health insurance and employment. With health insurance, it protects against increased premiums, being kicked off insurance or being forced to take a test in order to be insured. For employment it protects against hiring, firing and promoting decisions based on genetic test results. Health status due to a cancer diagnosis is not protected under GINA. Additionally, life, disability, and long-term care insurance is not protected under GINA.    PLAN: After considering the risks, benefits, and limitations, Mr. Walthall provided informed consent to pursue genetic testing and the blood sample will be sent to  [facility] for specific site analysis of the CHEK2 gene. Once [facility] receives the sample, results should be available within approximately two-three weeks' time, at which point they will be disclosed by telephone to Mr. Graffam, as will any additional recommendations warranted by these results. Mr. Speigner will receive a summary of his genetic counseling visit and a copy of his results once available. This information will also be available in Epic.   Mr. Gervacio questions were answered to his satisfaction today. Our contact information was provided should additional questions or concerns arise. Thank you for the referral and allowing [place] to share in the care of your patient.   Clint Guy, [place], [facility] Licensed, Certified [email or web address] [email or web address] Phone: [phone]  The patient was seen for a total of 40 minutes in face-to-face genetic counseling. Patient was seen alone. This patient was discussed with Drs. Magrinat, Lindi Adie and/or Burr Medico who agrees with the above.     _______________________________________________________________________ For Office Staff:  Number of people involved in session: 1 Was an Intern/ student involved with case: no

## 2021-06-01 ENCOUNTER — Inpatient Hospital Stay: Payer: 59 | Attending: Genetic Counselor

## 2021-06-01 ENCOUNTER — Other Ambulatory Visit: Payer: Self-pay

## 2021-06-01 LAB — GENETIC SCREENING ORDER

## 2021-06-14 DIAGNOSIS — Z1379 Encounter for other screening for genetic and chromosomal anomalies: Secondary | ICD-10-CM | POA: Insufficient documentation

## 2021-06-15 ENCOUNTER — Encounter: Payer: Self-pay | Admitting: Genetic Counselor

## 2021-06-15 ENCOUNTER — Ambulatory Visit: Payer: Self-pay | Admitting: Genetic Counselor

## 2021-06-15 ENCOUNTER — Telehealth: Payer: Self-pay | Admitting: Genetic Counselor

## 2021-06-15 DIAGNOSIS — Z1379 Encounter for other screening for genetic and chromosomal anomalies: Secondary | ICD-10-CM

## 2021-06-15 NOTE — Telephone Encounter (Signed)
Revealed negative genetic testing:  The familial CHEK2 pathogenic variant was not detected on [name]'s testing. We call this a "true negative" result because a cancer predisposition gene mutation was identified in the family, and he did not inherit it. Given this negative result, [name]'s risk to develop a CHEK2-related cancer is expected to be the same as the general population's risk.   

## 2021-06-15 NOTE — Progress Notes (Signed)
HPI:  [name] was previously seen in the [facility] clinic due to a family history of cancer and a known CHEK2 gene mutation and concerns regarding a hereditary predisposition to cancer. Please refer to our prior cancer genetics clinic note for more information regarding our discussion, assessment and recommendations, at the time. [name] recent genetic test results were disclosed to him, as were recommendations warranted by these results. These results and recommendations are discussed in more detail below.  FAMILY HISTORY:  We obtained a detailed, 4-generation family history.  Significant diagnoses are listed below: Family History  Problem Relation Age of Onset   Hypertension Mother    Breast cancer Mother    Other Mother        CHEK2 gene mutation p.R95* (c.283C>T)   Hypertension Father    Hyperlipidemia Father    Hypertension Brother    Pancreatic cancer Maternal Grandmother        dx >50   Breast cancer Maternal Grandmother        dx >50   Pancreatic cancer Maternal Grandfather        dx >50   Cancer Paternal Grandmother        peritoneal cancer, dx 4s   Colon cancer Maternal Great-grandmother        MGM's mother   [name] has one son (age 42). He has one brother (age 90). Neither of these relatives have had cancer.   [name] mother is alive at age 44 and was recently diagnosed with breast cancer and tested positive for a CHEK2 gene mutation called p.R95* (c.283C>T). There are no maternal aunts/uncles. [name] maternal grandmother died older than 26 and had breast cancer and pancreatic cancer. His maternal grandfather died older than 73 with pancreatic cancer. A maternal great-grandmother (MGM's mother) had colon cancer.   [name] father is alive at age 53 without cancer. There is one paternal aunt, although [name] has limited information about her. [name] paternal grandmother died in her 6s with peritoneal cancer. He does not have much  information about his paternal grandfather.    [name] is aware of previous family history of genetic testing for hereditary cancer risks in his mother. Patient's ancestors are of unknown descent. There is no reported Ashkenazi Jewish ancestry. There is no known consanguinity.  GENETIC TEST RESULTS: Genetic testing reported out on 06/14/2021 through the Specific Site Analysis of CHEK2 test offered by [facility]. No pathogenic variants were detected.   We recommended [name] pursue testing for the familial CHEK2 gene mutation called p.R95* (c.283C>T). [name] test was normal and did not reveal the familial mutation. We call this result a true negative result because a hereditary cancer predisposition gene mutation was identified in [name] family, and he did not inherit it. Given this negative result, [name] chances of developing CHEK2-related cancers are the same as they are in the general population.   The test report will be scanned into EPIC and located under the Molecular Pathology section of the Results Review tab.  A portion of the result report is included below for reference.     We discussed with [name] that because current genetic testing is not perfect, it is possible there may be a gene mutation in one of these genes that current testing cannot detect, but that chance is small.  We also discussed that there could be another gene that has not yet been discovered, or that we have not  yet tested, that is responsible for the cancer diagnoses in the family. It is also possible there is a hereditary cause for the cancer in the family that [name] did not inherit and therefore was not identified in his testing.  Therefore, it is important to remain in touch with cancer genetics in the future so that we can continue to offer [name] the most up to date genetic testing.   ADDITIONAL GENETIC TESTING: We discussed with [name] that there are other genes that are associated with  increased cancer risk that can be analyzed. Should [name] wish to pursue additional genetic testing, we are happy to discuss and coordinate this testing, at any time.    CANCER SCREENING RECOMMENDATIONS: [name] test result is considered negative (normal). While reassuring, this does not definitively rule out a hereditary predisposition to cancer. It is still possible that there could be genetic mutations that are undetectable by current technology. There could be genetic mutations in genes that have not been tested or identified to increase cancer risk. Therefore, it is recommended he continue to follow the cancer management and screening guidelines provided by his primary healthcare provider.   RECOMMENDATIONS FOR FAMILY MEMBERS:  Individuals in this family might be at some increased risk of developing cancer, over the general population risk, simply due to the family history of cancer.  We recommended women in this family have a yearly mammogram beginning at age 34, or 62 years younger than the earliest onset of cancer, an annual clinical breast exam, and perform monthly breast self-exams. Women in this family should also have a gynecological exam as recommended by their primary provider. All family members should be referred for colonoscopy starting at age 36.  It is also possible there is a hereditary cause for the cancer in [name] family that he did not inherit and therefore was not identified in him.  Based on [name] family history, we recommended his father have genetic counseling and testing. [name] will let us know if we can be of any assistance in coordinating genetic counseling and/or testing for this family member.   FOLLOW-UP: Lastly, we discussed with [name] that cancer genetics is a rapidly advancing field and it is possible that new genetic tests will be appropriate for him and/or his family members in the future. We encouraged him to remain in contact with cancer genetics  on an annual basis so we can update his personal and family histories and let him know of advances in cancer genetics that may benefit this family.   Our contact number was provided. [name] questions were answered to his satisfaction, and he knows he is welcome to call us at anytime with additional questions or concerns.   Clint Guy, MS, [facility] Genetic Counselor [email or web address] Phone: [phone]

## 2021-06-29 ENCOUNTER — Telehealth: Payer: Self-pay | Admitting: Genetic Counselor

## 2021-06-29 NOTE — Telephone Encounter (Signed)
Stuart Newman called with questions regarding a charge from [facility] for his genetic counseling appointment, as he was under the impression that he would not have a bill. Clarified that the genetic testing did not and will not result in a bill for him, but the genetic counseling appointment is billed to insurance and may result in a charge. Provided him [facility]'s patient financial services phone number to discuss this bill and potential financial assistance options.

## 2021-08-11 NOTE — Progress Notes (Signed)
Complete Physical  Assessment and Plan:  Stuart Newman was seen today for annual exam.  Diagnoses and all orders for this visit:  Encounter for routine adult health examination without abnormal findings  Due Annually  Labile hypertension Recently at goal without medications Monitor blood pressure at home/work; call if consistently over 130/80 Discussed DASH diet.   Reminder to go to the ER if any CP, SOB, nausea, dizziness, severe HA, changes vision/speech, left arm numbness and tingling and jaw pain. - magnesium - CMP/GFR - EKG  Hyperlipidemia, unspecified hyperlipidemia type On Crestor 5 mg for LDL goal <100 Continue low cholesterol diet and exercise, weight loss encouraged Check lipid panel. -     Lipid panel -     TSH  Obesity (BMI 30.0-34.9) Long discussion about weight loss, diet, and exercise Recommended diet heavy in fruits and veggies and low in animal meats, cheeses, and dairy products, appropriate calorie intake Discussed appropriate weight for height, goal of <250 lb set for next visit, he feels this is achievable; discussed high fiber diet Follow up at next visit -     Hemoglobin A1c  Medication management -     CBC with Differential/Platelet -     COMPLETE METABOLIC PANEL WITH GFR -     Magnesium -     Urinalysis w microscopic + reflex culture - Microalbumin/creatinine urine ratio  Screening for hematuria or proteinuria -     Urinalysis w microscopic + reflex culture - Microablumin/creatinine urine ratio  Screening for diabetes mellitus -     Hemoglobin A1c  Vitamin D deficiency Discussed goal of 70, benefits Get on supplement, double up if forgetting to take Defer check today per patient request -     VITAMIN D 25 Hydroxy (Vit-D Deficiency, Fractures)  Screening for Ischemic Heart Disease  EKG   Orders Placed This Encounter  Procedures   CBC with Differential/Platelet   COMPLETE METABOLIC PANEL WITH GFR   Magnesium   Lipid panel   TSH    Hemoglobin A1c   VITAMIN D 25 Hydroxy (Vit-D Deficiency, Fractures)   Urinalysis, Routine w reflex microscopic   Microalbumin / creatinine urine ratio   EKG 12-Lead   Discussed med's effects and SE's. Screening labs and tests as requested with regular follow-up as recommended. Over 40 minutes of exam, counseling, chart review and critical decision making was performed  Future Appointments  Date Time Provider [place]  08/15/2022 10:00 AM Magda Bernheim, NP [facility] None    HPI  This very nice 40 y.o.male presents for complete physical. He has Labile hypertension; Obesity (BMI 30.0-34.9); Vitamin D deficiency; Hyperlipidemia; Abnormal EKG; Family history of gene mutation; Family history of breast cancer; Family history of pancreatic cancer; Family history of colon cancer; Family history of peritoneal cancer; and Genetic testing on their problem list.   Patient reports no complaints at this time.  Mother had genetic testing with positive mutation of CHEK2 gene, patient had testing and did not detect familial CHEK2 gene mutation. Will get colonoscopy and PSA age 26  He is married, 1 child. 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   BMI is Body mass index is 34.37 kg/m., he has not been working on diet, works physically active job, has access to gym but hasn't been using.  Pt has not been eating well the past 3 months due to taking mom to doctor appointments.  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 He denies chest pain, shortness of breath, dizziness.    He is on cholesterol medication rosuvastatin daily and denies myalgias. His cholesterol is not at goal. The cholesterol last visit was:   Lab Results  Component Value Date   CHOL 166 02/10/2021   HDL 36 (L) 02/10/2021   LDLCALC 109 (H) 02/10/2021   TRIG 103 02/10/2021   CHOLHDL 4.6 02/10/2021   Last A1C in the office was:  Lab Results  Component Value Date   HGBA1C 5.4 08/11/2020   Lab Results  Component Value Date   GFRNONAA 86 02/10/2021   Patient is not consistently on Vitamin D supplement, takes 2000 IU    Lab Results  Component Value Date   VD25OH 29 (L) 02/10/2021      Lab Results  Component Value Date   [id number] 086 08/11/2020     Current Medications:  Current Outpatient Medications on File Prior to Visit  Medication Sig Dispense Refill   CHOLECALCIFEROL PO Take 2,000 Units by mouth daily.     rosuvastatin (CRESTOR) 5 MG tablet Take  1 tablet  Daily  for Cholesterol 90 tablet 1   No current facility-administered medications on file prior to visit.   Health Maintenance:   Immunization History  Administered Date(s) Administered   Tdap 07/17/2017    TD/TDAP: 2018 Influenza: declines  Pneumovax: -N/A  Covid 19: declines   CXR: 2013 RUQ [place]: 2017   Colonoscopy: due age 59 EGD: n/a  Vision: no issues, last remote, 20/20 vision at work physical Dental: Dr. Cloretta Ned, last visit 8/ 2022 Derm: Dr. ?, had full screen 2019  Allergies:  Allergies  Allergen Reactions   Penicillins Hives, Shortness Of Breath and Swelling   Medical History:  has Labile hypertension; Obesity (BMI 30.0-34.9); Vitamin D deficiency; Hyperlipidemia; Abnormal EKG; Family history of gene mutation; Family history of breast cancer; Family history of pancreatic cancer; Family history of colon cancer; Family history of peritoneal cancer; and Genetic testing on their problem list. Surgical  History:  He  has a past surgical history that includes Skin graft. Family History:  Hisfamily history includes Breast cancer in his maternal grandmother and mother; Cancer in his paternal grandmother; Colon cancer in his maternal great-grandmother; Hyperlipidemia in his father; Hypertension in his brother, father, and mother; Other in his mother; Pancreatic cancer in his maternal grandfather and maternal grandmother. Social History:   reports that he has never smoked. He has never used smokeless tobacco. He reports current alcohol use of about 1.0 standard drink per week. He reports that he does not use drugs.  Review of Systems: Review of Systems  Constitutional: Negative.  Negative for malaise/fatigue and weight loss.  HENT: Negative.  Negative for hearing loss and tinnitus.   Eyes: Negative.  Negative for blurred vision and double vision.  Respiratory: Negative.  Negative for cough, sputum production, shortness of breath and wheezing.   Cardiovascular: Negative.  Negative for chest pain, palpitations, orthopnea, claudication, leg swelling and PND.  Gastrointestinal:  Negative.  Negative for abdominal pain, blood in stool, constipation, diarrhea, heartburn, melena, nausea and vomiting.  Genitourinary: Negative.  Negative for dysuria.  Musculoskeletal: Negative.  Negative for falls, joint pain and myalgias.  Skin: Negative.  Negative for rash.  Neurological:  Negative for dizziness, tingling, sensory change, weakness and headaches.  Endo/Heme/Allergies:  Negative for polydipsia. Does not bruise/bleed easily.  Psychiatric/Behavioral: Negative.  Negative for depression, memory loss, substance abuse and suicidal ideas. The patient is not nervous/anxious and does not have insomnia.   All other systems reviewed and are negative.  Physical Exam: Estimated body mass index is 34.37 kg/m as calculated from the following:   Height as of this encounter: $RemoveBeforeD'6\' 3"'YuuGRtCzonryqz$  (1.905 m).   Weight as of this  encounter: 275 lb (124.7 kg). BP 130/80   Pulse 82   Temp (!) 97.5 F (36.4 C)   Ht $R'6\' 3"'Yy$  (1.905 m)   Wt 275 lb (124.7 kg)   SpO2 98%   BMI 34.37 kg/m  General Appearance: Well nourished, in no apparent distress.  Eyes: PERRLA, EOMs, conjunctiva no swelling or erythema  Sinuses: No Frontal/maxillary tenderness  ENT/Mouth: Ext aud canals clear, normal light reflex with TMs without erythema, bulging. Good dentition. No erythema, swelling, or exudate on post pharynx. Tonsils not swollen or erythematous. Hearing normal.  Neck: Supple, thyroid normal. No bruits  Respiratory: Respiratory effort normal, BS equal bilaterally without rales, rhonchi, wheezing or stridor.  Cardio: RRR without murmurs, rubs or gallops. Brisk peripheral pulses without edema.  Chest: symmetric, with normal excursions and percussion.  Abdomen: Soft, nontender, no guarding, rebound, hernias, masses, or organomegaly.  Lymphatics: Non tender without lymphadenopathy.  Genitourinary: declines, doing regular self exams, no concerns Musculoskeletal: Full ROM all peripheral extremities, 5/5 strength, and normal gait.  Skin: Warm, dry without rashes, lesions, ecchymosis. Has well healed skin graft scar to upper abdomen/chest.  Neuro: Cranial nerves intact, reflexes equal bilaterally. Normal muscle tone, no cerebellar symptoms. Sensation intact.  Psych: Awake and oriented X 3, normal affect, Insight and Judgment appropriate.   EKG: EKG No st changes, normal sinus rhythm, previous abnormal q wave was not noted  Nisa Decaire W Bernal Luhman 10:40 AM [facility]

## 2021-08-15 ENCOUNTER — Encounter: Payer: Self-pay | Admitting: Nurse Practitioner

## 2021-08-15 ENCOUNTER — Other Ambulatory Visit: Payer: Self-pay

## 2021-08-15 ENCOUNTER — Ambulatory Visit (INDEPENDENT_AMBULATORY_CARE_PROVIDER_SITE_OTHER): Payer: 59 | Admitting: Nurse Practitioner

## 2021-08-15 VITALS — BP 130/80 | HR 82 | Temp 97.5°F | Ht 75.0 in | Wt 275.0 lb

## 2021-08-15 DIAGNOSIS — Z0000 Encounter for general adult medical examination without abnormal findings: Secondary | ICD-10-CM | POA: Diagnosis not present

## 2021-08-15 DIAGNOSIS — E559 Vitamin D deficiency, unspecified: Secondary | ICD-10-CM

## 2021-08-15 DIAGNOSIS — E66811 Obesity, class 1: Secondary | ICD-10-CM

## 2021-08-15 DIAGNOSIS — Z79899 Other long term (current) drug therapy: Secondary | ICD-10-CM

## 2021-08-15 DIAGNOSIS — Z1389 Encounter for screening for other disorder: Secondary | ICD-10-CM

## 2021-08-15 DIAGNOSIS — Z136 Encounter for screening for cardiovascular disorders: Secondary | ICD-10-CM

## 2021-08-15 DIAGNOSIS — E669 Obesity, unspecified: Secondary | ICD-10-CM

## 2021-08-15 DIAGNOSIS — E782 Mixed hyperlipidemia: Secondary | ICD-10-CM

## 2021-08-15 DIAGNOSIS — R0989 Other specified symptoms and signs involving the circulatory and respiratory systems: Secondary | ICD-10-CM

## 2021-08-15 DIAGNOSIS — Z1329 Encounter for screening for other suspected endocrine disorder: Secondary | ICD-10-CM

## 2021-08-15 DIAGNOSIS — Z131 Encounter for screening for diabetes mellitus: Secondary | ICD-10-CM

## 2021-08-15 NOTE — Patient Instructions (Signed)

## 2021-08-16 ENCOUNTER — Other Ambulatory Visit: Payer: Self-pay | Admitting: Nurse Practitioner

## 2021-08-16 DIAGNOSIS — E782 Mixed hyperlipidemia: Secondary | ICD-10-CM

## 2021-08-16 LAB — URINALYSIS, ROUTINE W REFLEX MICROSCOPIC
Bilirubin Urine: NEGATIVE
Glucose, UA: NEGATIVE
Hgb urine dipstick: NEGATIVE
Ketones, ur: NEGATIVE
Leukocytes,Ua: NEGATIVE
Nitrite: NEGATIVE
Protein, ur: NEGATIVE
Specific Gravity, Urine: 1.007 (ref 1.001–1.035)
pH: 5.5 (ref 5.0–8.0)

## 2021-08-16 LAB — TSH: TSH: 1.63 mIU/L (ref 0.40–4.50)

## 2021-08-16 LAB — CBC WITH DIFFERENTIAL/PLATELET
Absolute Monocytes: 561 cells/uL (ref 200–950)
Basophils Absolute: 59 cells/uL (ref 0–200)
Basophils Relative: 0.9 %
Eosinophils Absolute: 224 cells/uL (ref 15–500)
Eosinophils Relative: 3.4 %
HCT: 45.3 % (ref 38.5–50.0)
Hemoglobin: 14.9 g/dL (ref 13.2–17.1)
Lymphs Abs: 2409 cells/uL (ref 850–3900)
MCH: 29.9 pg (ref 27.0–33.0)
MCHC: 32.9 g/dL (ref 32.0–36.0)
MCV: 91 fL (ref 80.0–100.0)
MPV: 10.8 fL (ref 7.5–12.5)
Monocytes Relative: 8.5 %
Neutro Abs: 3346 cells/uL (ref 1500–7800)
Neutrophils Relative %: 50.7 %
Platelets: 271 10*3/uL (ref 140–400)
RBC: 4.98 10*6/uL (ref 4.20–5.80)
RDW: 12.8 % (ref 11.0–15.0)
Total Lymphocyte: 36.5 %
WBC: 6.6 10*3/uL (ref 3.8–10.8)

## 2021-08-16 LAB — MICROALBUMIN / CREATININE URINE RATIO
Creatinine, Urine: 36 mg/dL (ref 20–320)
Microalb, Ur: <0.2 mg/dL

## 2021-08-16 LAB — COMPLETE METABOLIC PANEL WITH GFR
AG Ratio: 1.7 (calc) (ref 1.0–2.5)
ALT: 16 U/L (ref 9–46)
AST: 15 U/L (ref 10–40)
Albumin: 4.7 g/dL (ref 3.6–5.1)
Alkaline phosphatase (APISO): 95 U/L (ref 36–130)
BUN: 13 mg/dL (ref 7–25)
CO2: 29 mmol/L (ref 20–32)
Calcium: 9.8 mg/dL (ref 8.6–10.3)
Chloride: 105 mmol/L (ref 98–110)
Creat: 1.01 mg/dL (ref 0.60–1.29)
Globulin: 2.7 g/dL (calc) (ref 1.9–3.7)
Glucose, Bld: 95 mg/dL (ref 65–99)
Potassium: 4.6 mmol/L (ref 3.5–5.3)
Sodium: 141 mmol/L (ref 135–146)
Total Bilirubin: 0.5 mg/dL (ref 0.2–1.2)
Total Protein: 7.4 g/dL (ref 6.1–8.1)
eGFR: 96 mL/min/{1.73_m2} (ref >=60)

## 2021-08-16 LAB — LIPID PANEL
Cholesterol: 176 mg/dL (ref <200)
HDL: 40 mg/dL (ref >=40)
LDL Cholesterol (Calc): 104 mg/dL (calc) — ABNORMAL HIGH
Non-HDL Cholesterol (Calc): 136 mg/dL (calc) — ABNORMAL HIGH (ref <130)
Total CHOL/HDL Ratio: 4.4 (calc) (ref <5.0)
Triglycerides: 197 mg/dL — ABNORMAL HIGH (ref <150)

## 2021-08-16 LAB — HEMOGLOBIN A1C
Hgb A1c MFr Bld: 5.3 % of total Hgb (ref <5.7)
Mean Plasma Glucose: 105 mg/dL
eAG (mmol/L): 5.8 mmol/L

## 2021-08-16 LAB — MAGNESIUM: Magnesium: 2.2 mg/dL (ref 1.5–2.5)

## 2021-08-16 LAB — VITAMIN D 25 HYDROXY (VIT D DEFICIENCY, FRACTURES): Vit D, 25-Hydroxy: 38 ng/mL (ref 30–100)

## 2021-08-16 MED ORDER — ROSUVASTATIN CALCIUM 10 MG PO TABS: 10.0000 mg | ORAL_TABLET | Freq: Every day | ORAL | 11 refills | Status: DC

## 2022-08-08 ENCOUNTER — Other Ambulatory Visit: Payer: Self-pay | Admitting: Nurse Practitioner

## 2022-08-08 DIAGNOSIS — E782 Mixed hyperlipidemia: Secondary | ICD-10-CM

## 2022-08-14 NOTE — Progress Notes (Deleted)
 Complete Physical  Assessment and Plan:  Stuart Newman was seen today for annual exam.  Diagnoses and all orders for this visit:  Encounter for routine adult health examination without abnormal findings  Due Annually  Labile hypertension Recently at goal without medications Monitor blood pressure at home/work; call if consistently over 130/80 Discussed DASH diet.   Reminder to go to the ER if any CP, SOB, nausea, dizziness, severe HA, changes vision/speech, left arm numbness and tingling and jaw pain. - magnesium - CMP/GFR - EKG  Hyperlipidemia, unspecified hyperlipidemia type On Crestor 5 mg for LDL goal <100 Continue low cholesterol diet and exercise, weight loss encouraged Check lipid panel. -     Lipid panel -     TSH  Obesity (BMI 30.0-34.9) Long discussion about weight loss, diet, and exercise Recommended diet heavy in fruits and veggies and low in animal meats, cheeses, and dairy products, appropriate calorie intake Discussed appropriate weight for height, goal of <250 lb set for next visit, he feels this is achievable; discussed high fiber diet Follow up at next visit -     Hemoglobin A1c  Medication management -     CBC with Differential/Platelet -     COMPLETE METABOLIC PANEL WITH GFR -     Magnesium -     Urinalysis w microscopic + reflex culture - Microalbumin/creatinine urine ratio  Screening for hematuria or proteinuria -     Urinalysis w microscopic + reflex culture - Microablumin/creatinine urine ratio  Screening for diabetes mellitus -     Hemoglobin A1c  Vitamin D deficiency Discussed goal of 70, benefits Get on supplement, double up if forgetting to take Defer check today per patient request -     VITAMIN D 25 Hydroxy (Vit-D Deficiency, Fractures)  Screening for Ischemic Heart Disease  EKG   No orders of the defined types were placed in this encounter.  Discussed med's effects and SE's. Screening labs and tests as requested with regular  follow-up as recommended. Over 40 minutes of exam, counseling, chart review and critical decision making was performed  Future Appointments  Date Time Provider [place]  08/15/2022 10:00 AM Alycia Rossetti, NP [facility] None    HPI  This very nice 41 y.o.male presents for complete physical. He has Labile hypertension; Obesity (BMI 30.0-34.9); Vitamin D deficiency; Hyperlipidemia; Abnormal EKG; Family history of gene mutation; Family history of breast cancer; Family history of pancreatic cancer; Family history of colon cancer; Family history of peritoneal cancer; and Genetic testing on their problem list.   Patient reports no complaints at this time.  Mother had genetic testing with positive mutation of CHEK2 gene, patient had testing and did not detect familial CHEK2 gene mutation. Will get colonoscopy and PSA age 35  He is married, 1 child. 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   BMI is There is no height or weight on file to calculate BMI., he has not been working on diet, works physically active job, has access to gym but hasn't been using.  Pt has not been eating well the past 3 months due to taking mom to doctor appointments.  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 He denies chest pain, shortness of breath, dizziness.    He is on cholesterol medication rosuvastatin daily and denies myalgias. His cholesterol is not at goal. The cholesterol last visit was:   Lab Results   Component Value Date   CHOL 176 08/15/2021   HDL 40 08/15/2021   LDLCALC 104 (H) 08/15/2021   TRIG 197 (H) 08/15/2021   CHOLHDL 4.4 08/15/2021   Last A1C in the office was:  Lab Results  Component Value Date   HGBA1C 5.3 08/15/2021   Lab Results  Component Value Date   GFRNONAA 86 02/10/2021   Patient is not consistently on Vitamin D supplement, takes 2000 IU    Lab Results  Component Value Date   VD25OH 38 08/15/2021      Lab Results  Component Value Date   [id number] 373 08/11/2020     Current Medications:  Current Outpatient Medications on File Prior to Visit  Medication Sig Dispense Refill   CHOLECALCIFEROL PO Take 2,000 Units by mouth daily.     rosuvastatin (CRESTOR) 10 MG tablet TAKE 1 TABLET BY MOUTH DAILY 30 tablet 11   No current facility-administered medications on file prior to visit.   Health Maintenance:   Immunization History  Administered Date(s) Administered   Tdap 07/17/2017    TD/TDAP: 2018 Influenza: declines  Pneumovax: -N/A  Covid 19: declines   CXR: 2013 RUQ [place]: 2017   Colonoscopy: due age 66 EGD: n/a  Vision: no issues, last remote, 20/20 vision at work physical Dental: Dr. Cloretta Ned, last visit 8/ 2022 Derm: Dr. ?, had full screen 2019  Allergies:  Allergies  Allergen Reactions   Penicillins Hives, Shortness Of Breath and Swelling   Medical History:  has Labile hypertension; Obesity (BMI 30.0-34.9); Vitamin D deficiency; Hyperlipidemia; Abnormal EKG; Family history of gene mutation; Family history of breast cancer; Family history of pancreatic cancer; Family history of colon cancer; Family history of peritoneal cancer; and Genetic testing on their problem list. Surgical History:  He  has a past surgical history that includes Skin graft. Family History:  Hisfamily history includes Breast cancer in his maternal grandmother and mother; Cancer in his paternal grandmother; Colon cancer in his maternal great-grandmother;  Hyperlipidemia in his father; Hypertension in his brother, father, and mother; Other in his mother; Pancreatic cancer in his maternal grandfather and maternal grandmother. Social History:   reports that he has never smoked. He has never used smokeless tobacco. He reports current alcohol use of about 1.0 standard drink of alcohol per week. He reports that he does not use drugs.  Review of Systems: Review of Systems  Constitutional: Negative.  Negative for malaise/fatigue and weight loss.  HENT: Negative.  Negative for hearing loss and tinnitus.   Eyes: Negative.  Negative for blurred vision and double vision.  Respiratory: Negative.  Negative for cough, sputum production, shortness of breath and wheezing.   Cardiovascular: Negative.  Negative for chest pain, palpitations, orthopnea, claudication, leg swelling and PND.  Gastrointestinal: Negative.  Negative for abdominal pain, blood in stool, constipation, diarrhea, heartburn, melena, nausea and vomiting.  Genitourinary: Negative.  Negative for dysuria.  Musculoskeletal: Negative.  Negative for falls, joint pain and myalgias.  Skin: Negative.  Negative for rash.  Neurological:  Negative for  dizziness, tingling, sensory change, weakness and headaches.  Endo/Heme/Allergies:  Negative for polydipsia. Does not bruise/bleed easily.  Psychiatric/Behavioral: Negative.  Negative for depression, memory loss, substance abuse and suicidal ideas. The patient is not nervous/anxious and does not have insomnia.   All other systems reviewed and are negative.   Physical Exam: Estimated body mass index is 34.37 kg/m as calculated from the following:   Height as of 08/15/21: $RemoveBefo'6\' 3"'RZGbFtJvacK$  (1.905 m).   Weight as of 08/15/21: 275 lb (124.7 kg). There were no vitals taken for this visit. General Appearance: Well nourished, in no apparent distress.  Eyes: PERRLA, EOMs, conjunctiva no swelling or erythema  Sinuses: No Frontal/maxillary tenderness  ENT/Mouth: Ext aud  canals clear, normal light reflex with TMs without erythema, bulging. Good dentition. No erythema, swelling, or exudate on post pharynx. Tonsils not swollen or erythematous. Hearing normal.  Neck: Supple, thyroid normal. No bruits  Respiratory: Respiratory effort normal, BS equal bilaterally without rales, rhonchi, wheezing or stridor.  Cardio: RRR without murmurs, rubs or gallops. Brisk peripheral pulses without edema.  Chest: symmetric, with normal excursions and percussion.  Abdomen: Soft, nontender, no guarding, rebound, hernias, masses, or organomegaly.  Lymphatics: Non tender without lymphadenopathy.  Genitourinary: declines, doing regular self exams, no concerns Musculoskeletal: Full ROM all peripheral extremities, 5/5 strength, and normal gait.  Skin: Warm, dry without rashes, lesions, ecchymosis. Has well healed skin graft scar to upper abdomen/chest.  Neuro: Cranial nerves intact, reflexes equal bilaterally. Normal muscle tone, no cerebellar symptoms. Sensation intact.  Psych: Awake and oriented X 3, normal affect, Insight and Judgment appropriate.   EKG: EKG No st changes, normal sinus rhythm, previous abnormal q wave was not noted  [place] E  9:06 AM [facility]

## 2022-08-15 ENCOUNTER — Encounter: Payer: 59 | Admitting: Nurse Practitioner
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
## 2022-09-06 ENCOUNTER — Encounter: Payer: Self-pay | Admitting: Nurse Practitioner

## 2022-09-06 ENCOUNTER — Ambulatory Visit (INDEPENDENT_AMBULATORY_CARE_PROVIDER_SITE_OTHER): Payer: 59 | Admitting: Nurse Practitioner

## 2022-09-06 VITALS — BP 130/86 | HR 98 | Temp 97.7°F | Ht 75.0 in | Wt 282.6 lb

## 2022-09-06 DIAGNOSIS — Z136 Encounter for screening for cardiovascular disorders: Secondary | ICD-10-CM | POA: Diagnosis not present

## 2022-09-06 DIAGNOSIS — Z79899 Other long term (current) drug therapy: Secondary | ICD-10-CM

## 2022-09-06 DIAGNOSIS — R0989 Other specified symptoms and signs involving the circulatory and respiratory systems: Secondary | ICD-10-CM | POA: Diagnosis not present

## 2022-09-06 DIAGNOSIS — Z0000 Encounter for general adult medical examination without abnormal findings: Secondary | ICD-10-CM | POA: Diagnosis not present

## 2022-09-06 DIAGNOSIS — E669 Obesity, unspecified: Secondary | ICD-10-CM

## 2022-09-06 DIAGNOSIS — E559 Vitamin D deficiency, unspecified: Secondary | ICD-10-CM

## 2022-09-06 DIAGNOSIS — E782 Mixed hyperlipidemia: Secondary | ICD-10-CM

## 2022-09-06 DIAGNOSIS — Z1329 Encounter for screening for other suspected endocrine disorder: Secondary | ICD-10-CM

## 2022-09-06 DIAGNOSIS — Z1389 Encounter for screening for other disorder: Secondary | ICD-10-CM

## 2022-09-06 DIAGNOSIS — Z131 Encounter for screening for diabetes mellitus: Secondary | ICD-10-CM

## 2022-09-06 NOTE — Patient Instructions (Signed)

## 2022-09-07 LAB — CBC WITH DIFFERENTIAL/PLATELET
Absolute Monocytes: 595 cells/uL (ref 200–950)
Basophils Absolute: 63 cells/uL (ref 0–200)
Basophils Relative: 0.9 %
Eosinophils Absolute: 140 cells/uL (ref 15–500)
Eosinophils Relative: 2 %
HCT: 45.6 % (ref 38.5–50.0)
Hemoglobin: 15.2 g/dL (ref 13.2–17.1)
Lymphs Abs: 2142 cells/uL (ref 850–3900)
MCH: 30 pg (ref 27.0–33.0)
MCHC: 33.3 g/dL (ref 32.0–36.0)
MCV: 89.9 fL (ref 80.0–100.0)
MPV: 10.5 fL (ref 7.5–12.5)
Monocytes Relative: 8.5 %
Neutro Abs: 4060 cells/uL (ref 1500–7800)
Neutrophils Relative %: 58 %
Platelets: 284 10*3/uL (ref 140–400)
RBC: 5.07 10*6/uL (ref 4.20–5.80)
RDW: 13.2 % (ref 11.0–15.0)
Total Lymphocyte: 30.6 %
WBC: 7 10*3/uL (ref 3.8–10.8)

## 2022-09-07 LAB — URINALYSIS, ROUTINE W REFLEX MICROSCOPIC
Bilirubin Urine: NEGATIVE
Glucose, UA: NEGATIVE
Hgb urine dipstick: NEGATIVE
Ketones, ur: NEGATIVE
Leukocytes,Ua: NEGATIVE
Nitrite: NEGATIVE
Protein, ur: NEGATIVE
Specific Gravity, Urine: 1.01 (ref 1.001–1.035)
pH: 6 (ref 5.0–8.0)

## 2022-09-07 LAB — LIPID PANEL
Cholesterol: 162 mg/dL
HDL: 48 mg/dL
LDL Cholesterol (Calc): 89 mg/dL
Non-HDL Cholesterol (Calc): 114 mg/dL
Total CHOL/HDL Ratio: 3.4 (calc)
Triglycerides: 158 mg/dL — ABNORMAL HIGH

## 2022-09-07 LAB — COMPLETE METABOLIC PANEL WITH GFR
AG Ratio: 1.7 (calc) (ref 1.0–2.5)
ALT: 19 U/L (ref 9–46)
AST: 16 U/L (ref 10–40)
Albumin: 4.8 g/dL (ref 3.6–5.1)
Alkaline phosphatase (APISO): 100 U/L (ref 36–130)
BUN: 19 mg/dL (ref 7–25)
CO2: 25 mmol/L (ref 20–32)
Calcium: 10 mg/dL (ref 8.6–10.3)
Chloride: 105 mmol/L (ref 98–110)
Creat: 1.01 mg/dL (ref 0.60–1.29)
Globulin: 2.8 g/dL (calc) (ref 1.9–3.7)
Glucose, Bld: 90 mg/dL (ref 65–99)
Potassium: 4.5 mmol/L (ref 3.5–5.3)
Sodium: 139 mmol/L (ref 135–146)
Total Bilirubin: 0.3 mg/dL (ref 0.2–1.2)
Total Protein: 7.6 g/dL (ref 6.1–8.1)
eGFR: 96 mL/min/{1.73_m2} (ref >=60)

## 2022-09-07 LAB — TSH: TSH: 1.68 mIU/L (ref 0.40–4.50)

## 2022-09-07 LAB — HEMOGLOBIN A1C
Hgb A1c MFr Bld: 5.6 % of total Hgb (ref <5.7)
Mean Plasma Glucose: 114 mg/dL
eAG (mmol/L): 6.3 mmol/L

## 2022-09-07 LAB — MICROALBUMIN / CREATININE URINE RATIO
Creatinine, Urine: 42 mg/dL (ref 20–320)
Microalb, Ur: <0.2 mg/dL

## 2022-09-07 LAB — MAGNESIUM: Magnesium: 2.3 mg/dL (ref 1.5–2.5)

## 2022-09-07 LAB — VITAMIN D 25 HYDROXY (VIT D DEFICIENCY, FRACTURES): Vit D, 25-Hydroxy: 27 ng/mL — ABNORMAL LOW (ref 30–100)

## 2023-07-16 ENCOUNTER — Other Ambulatory Visit: Payer: Self-pay | Admitting: Nurse Practitioner

## 2023-07-16 DIAGNOSIS — E782 Mixed hyperlipidemia: Secondary | ICD-10-CM

## 2023-08-16 ENCOUNTER — Encounter: Payer: 59 | Admitting: Nurse Practitioner
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
## 2023-09-07 ENCOUNTER — Ambulatory Visit (INDEPENDENT_AMBULATORY_CARE_PROVIDER_SITE_OTHER): Payer: 59 | Admitting: Nurse Practitioner

## 2023-09-07 ENCOUNTER — Encounter: Payer: Self-pay | Admitting: Nurse Practitioner

## 2023-09-07 VITALS — BP 130/82 | HR 78 | Temp 97.9°F | Resp 16 | Ht 75.5 in | Wt 284.8 lb

## 2023-09-07 DIAGNOSIS — Z131 Encounter for screening for diabetes mellitus: Secondary | ICD-10-CM

## 2023-09-07 DIAGNOSIS — E559 Vitamin D deficiency, unspecified: Secondary | ICD-10-CM

## 2023-09-07 DIAGNOSIS — Z1329 Encounter for screening for other suspected endocrine disorder: Secondary | ICD-10-CM

## 2023-09-07 DIAGNOSIS — Z0000 Encounter for general adult medical examination without abnormal findings: Secondary | ICD-10-CM

## 2023-09-07 DIAGNOSIS — Z79899 Other long term (current) drug therapy: Secondary | ICD-10-CM

## 2023-09-07 DIAGNOSIS — E66811 Obesity, class 1: Secondary | ICD-10-CM

## 2023-09-07 DIAGNOSIS — Z136 Encounter for screening for cardiovascular disorders: Secondary | ICD-10-CM

## 2023-09-07 DIAGNOSIS — Z1389 Encounter for screening for other disorder: Secondary | ICD-10-CM

## 2023-09-07 DIAGNOSIS — R0989 Other specified symptoms and signs involving the circulatory and respiratory systems: Secondary | ICD-10-CM

## 2023-09-07 DIAGNOSIS — Z0001 Encounter for general adult medical examination with abnormal findings: Secondary | ICD-10-CM

## 2023-09-07 DIAGNOSIS — E782 Mixed hyperlipidemia: Secondary | ICD-10-CM

## 2023-09-07 DIAGNOSIS — G8929 Other chronic pain: Secondary | ICD-10-CM

## 2023-09-07 NOTE — Patient Instructions (Signed)

## 2023-09-08 LAB — URINALYSIS, ROUTINE W REFLEX MICROSCOPIC
Bilirubin Urine: NEGATIVE
Glucose, UA: NEGATIVE
Hgb urine dipstick: NEGATIVE
Ketones, ur: NEGATIVE
Leukocytes,Ua: NEGATIVE
Nitrite: NEGATIVE
Protein, ur: NEGATIVE
Specific Gravity, Urine: 1.014 (ref 1.001–1.035)
pH: 5.5 (ref 5.0–8.0)

## 2023-09-08 LAB — COMPLETE METABOLIC PANEL WITH GFR
AG Ratio: 1.7 (calc) (ref 1.0–2.5)
ALT: 19 U/L (ref 9–46)
AST: 14 U/L (ref 10–40)
Albumin: 4.7 g/dL (ref 3.6–5.1)
Alkaline phosphatase (APISO): 103 U/L (ref 36–130)
BUN: 19 mg/dL (ref 7–25)
CO2: 27 mmol/L (ref 20–32)
Calcium: 10.2 mg/dL (ref 8.6–10.3)
Chloride: 106 mmol/L (ref 98–110)
Creat: 1.02 mg/dL (ref 0.60–1.29)
Globulin: 2.7 g/dL (ref 1.9–3.7)
Glucose, Bld: 92 mg/dL (ref 65–99)
Potassium: 4.4 mmol/L (ref 3.5–5.3)
Sodium: 140 mmol/L (ref 135–146)
Total Bilirubin: 0.4 mg/dL (ref 0.2–1.2)
Total Protein: 7.4 g/dL (ref 6.1–8.1)
eGFR: 94 mL/min/{1.73_m2} (ref >=60)

## 2023-09-08 LAB — CBC WITH DIFFERENTIAL/PLATELET
Absolute Lymphocytes: 2224 {cells}/uL (ref 850–3900)
Absolute Monocytes: 634 {cells}/uL (ref 200–950)
Basophils Absolute: 59 {cells}/uL (ref 0–200)
Basophils Relative: 0.9 %
Eosinophils Absolute: 198 {cells}/uL (ref 15–500)
Eosinophils Relative: 3 %
HCT: 45.5 % (ref 38.5–50.0)
Hemoglobin: 15 g/dL (ref 13.2–17.1)
MCH: 30.2 pg (ref 27.0–33.0)
MCHC: 33 g/dL (ref 32.0–36.0)
MCV: 91.7 fL (ref 80.0–100.0)
MPV: 10.7 fL (ref 7.5–12.5)
Monocytes Relative: 9.6 %
Neutro Abs: 3485 {cells}/uL (ref 1500–7800)
Neutrophils Relative %: 52.8 %
Platelets: 292 10*3/uL (ref 140–400)
RBC: 4.96 10*6/uL (ref 4.20–5.80)
RDW: 13.1 % (ref 11.0–15.0)
Total Lymphocyte: 33.7 %
WBC: 6.6 10*3/uL (ref 3.8–10.8)

## 2023-09-08 LAB — MICROALBUMIN / CREATININE URINE RATIO
Creatinine, Urine: 86 mg/dL (ref 20–320)
Microalb Creat Ratio: 2 mg/g{creat} (ref <30)
Microalb, Ur: 0.2 mg/dL

## 2023-09-08 LAB — LIPID PANEL
Cholesterol: 173 mg/dL (ref <200)
HDL: 34 mg/dL — ABNORMAL LOW (ref >=40)
LDL Cholesterol (Calc): 105 mg/dL — ABNORMAL HIGH
Non-HDL Cholesterol (Calc): 139 mg/dL — ABNORMAL HIGH (ref <130)
Total CHOL/HDL Ratio: 5.1 (calc) — ABNORMAL HIGH (ref <5.0)
Triglycerides: 218 mg/dL — ABNORMAL HIGH (ref <150)

## 2023-09-08 LAB — MAGNESIUM: Magnesium: 2.3 mg/dL (ref 1.5–2.5)

## 2023-09-08 LAB — VITAMIN D 25 HYDROXY (VIT D DEFICIENCY, FRACTURES): Vit D, 25-Hydroxy: 35 ng/mL (ref 30–100)

## 2023-09-08 LAB — TSH: TSH: 1.54 m[IU]/L (ref 0.40–4.50)

## 2023-09-08 LAB — HEMOGLOBIN A1C W/OUT EAG: Hgb A1c MFr Bld: 5.6 %{Hb} (ref <5.7)

## 2023-09-13 ENCOUNTER — Ambulatory Visit
Admission: RE | Admit: 2023-09-13 | Discharge: 2023-09-13 | Disposition: A | Discharge status: Home / Self Care | Payer: 59 | Source: Ambulatory Visit | Attending: Nurse Practitioner | Admitting: Nurse Practitioner

## 2023-09-13 DIAGNOSIS — G8929 Other chronic pain: Secondary | ICD-10-CM

## 2023-12-11 ENCOUNTER — Encounter: Payer: Self-pay | Admitting: *Deleted

## 2024-03-11 ENCOUNTER — Ambulatory Visit: Payer: 59 | Admitting: Nurse Practitioner

## 2024-07-10 ENCOUNTER — Ambulatory Visit

## 2024-07-10 VITALS — BP 134/80 | HR 82 | Temp 97.9°F | Ht 73.75 in | Wt 284.0 lb

## 2024-07-10 DIAGNOSIS — Z113 Encounter for screening for infections with a predominantly sexual mode of transmission: Secondary | ICD-10-CM

## 2024-07-10 DIAGNOSIS — R030 Elevated blood-pressure reading, without diagnosis of hypertension: Secondary | ICD-10-CM | POA: Insufficient documentation

## 2024-07-10 DIAGNOSIS — E66811 Obesity, class 1: Secondary | ICD-10-CM | POA: Diagnosis not present

## 2024-07-10 DIAGNOSIS — Z136 Encounter for screening for cardiovascular disorders: Secondary | ICD-10-CM | POA: Diagnosis not present
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
## 2024-07-10 NOTE — Patient Instructions (Addendum)
 Thank you for visiting [facility] today! Here's what we talked about: - Please read through your healthy eating plan

## 2024-08-06 ENCOUNTER — Ambulatory Visit

## 2024-08-06 DIAGNOSIS — L814 Other melanin hyperpigmentation: Secondary | ICD-10-CM

## 2024-08-06 DIAGNOSIS — D1801 Hemangioma of skin and subcutaneous tissue: Secondary | ICD-10-CM

## 2024-08-06 DIAGNOSIS — W908XXA Exposure to other nonionizing radiation, initial encounter: Secondary | ICD-10-CM | POA: Diagnosis not present

## 2024-08-06 DIAGNOSIS — L578 Other skin changes due to chronic exposure to nonionizing radiation: Secondary | ICD-10-CM

## 2024-08-06 DIAGNOSIS — D229 Melanocytic nevi, unspecified: Secondary | ICD-10-CM

## 2024-08-06 DIAGNOSIS — L821 Other seborrheic keratosis: Secondary | ICD-10-CM | POA: Diagnosis not present

## 2024-08-06 DIAGNOSIS — Z1283 Encounter for screening for malignant neoplasm of skin: Secondary | ICD-10-CM | POA: Diagnosis not present

## 2024-08-06 DIAGNOSIS — L820 Inflamed seborrheic keratosis: Secondary | ICD-10-CM

## 2024-08-06 NOTE — Progress Notes (Deleted)
    Subjective   Stuart Newman is a 43 y.o. male who presents for the following: Total body skin exam for skin cancer screening and mole check. The patient has spots, moles and lesions to be evaluated, some may be new or changing and the patient may have concern these could be cancer.. Patient is new patient  Today patient reports: Area of concern on the back   Review of Systems:    No other skin or systemic complaints except as noted in HPI or Assessment and Plan.  The following portions of the chart were reviewed this encounter and updated as appropriate: medications, allergies, medical history  Relevant Medical History:  n/a   Objective  Well appearing patient in no apparent distress; mood and affect are within normal limits. Examination was performed of the: Full Skin Examination: scalp, head, eyes, ears, nose, lips, neck, chest, axillae, abdomen, back, buttocks, bilateral upper extremities, bilateral lower extremities, hands, feet, fingers, toes, fingernails, and toenails.   Examination notable for: {Exam:33578}  Examination limited by: Undergarments and Patient deferred removal       Assessment & Plan   SKIN CANCER SCREENING PERFORMED TODAY.  BENIGN SKIN FINDINGS  - Lentigines  - Seborrheic keratoses  - Hemangiomas   - Nevus/Multiple Benign Nevi  - ***  - Reassurance provided regarding the benign appearance of lesions noted on exam today; no treatment is indicated in the absence of symptoms/changes. - Reinforced importance of photoprotective strategies including liberal and frequent sunscreen use of a broad-spectrum SPF 30 or greater, use of protective clothing, and sun avoidance for prevention of cutaneous malignancy and photoaging.  Counseled patient on the importance of regular self-skin monitoring as well as routine clinical skin examinations as scheduled.   ACTINIC DAMAGE - Chronic condition, secondary to cumulative UV/sun exposure - Recommend daily broad spectrum  sunscreen SPF 30+ to sun-exposed areas, reapply every 2 hours as needed.  - Staying in the shade or wearing long sleeves, sun glasses (UVA+UVB protection) and wide brim hats (4-inch brim around the entire circumference of the hat) are also recommended for sun protection.  - Call for new or changing lesions.   Level of service outlined above   Procedures, orders, diagnosis for this visit:    There are no diagnoses linked to this encounter.  Return to clinic: No follow-ups on file.  Documentation:  I, Emerick Ege, CMA am acting as scribe for Lauraine JAYSON Kanaris, MD  I have reviewed the above documentation for accuracy and completeness, and I agree with the above.  Lauraine JAYSON Kanaris, MD

## 2024-08-06 NOTE — Progress Notes (Signed)
    Subjective   Stuart Newman is a 43 y.o. male who presents for the following: Lesion(s) of concern . Patient is new patient  Today patient reports: Area of concern on the back  Review of Systems:    No other skin or systemic complaints except as noted in HPI or Assessment and Plan.  The following portions of the chart were reviewed this encounter and updated as appropriate: medications, allergies, medical history  Relevant Medical History:  n/a   Objective  Well appearing patient in no apparent distress; mood and affect are within normal limits. Examination was performed of the: Waist Up Skin Exam: scalp, head, eyes, ears, nose, lips, neck, chest, axillae, upper extremities, abdomen, back, hands, fingers, fingernails   Examination notable for: SKIN EXAM, Angioma(s): Scattered red vascular papule(s)  , Lentigo/lentigines: Scattered pigmented macules that are tan to brown in color and are somewhat non-uniform in shape and concentrated in the sun-exposed areas, Nevus/nevi: Scattered well-demarcated, regular, pigmented macule(s) and/or papule(s)  , Seborrheic Keratosis(es): Stuck-on appearing keratotic papule(s) on the trunk, some  irritated with redness, crusting, edema, and/or partial avulsion, Actinic Damage/Elastosis: chronic sun damage: dyspigmentation, telangiectasia, and wrinkling  Examination limited by: Shoes or socks , Clothing, and Patient deferred removal     Mid Back Stuck on waxy paps with erythema  Assessment & Plan   SKIN CANCER SCREENING PERFORMED TODAY.  BENIGN SKIN FINDINGS  - Lentigines  - Seborrheic keratoses  - Hemangiomas   - Nevus/Multiple Benign Nevi - Reassurance provided regarding the benign appearance of lesions noted on exam today; no treatment is indicated in the absence of symptoms/changes. - Reinforced importance of photoprotective strategies including liberal and frequent sunscreen use of a broad-spectrum SPF 30 or greater, use of protective  clothing, and sun avoidance for prevention of cutaneous malignancy and photoaging.  Counseled patient on the importance of regular self-skin monitoring as well as routine clinical skin examinations as scheduled.   ACTINIC DAMAGE - Chronic condition, secondary to cumulative UV/sun exposure - Recommend daily broad spectrum sunscreen SPF 30+ to sun-exposed areas, reapply every 2 hours as needed.  - Staying in the shade or wearing long sleeves, sun glasses (UVA+UVB protection) and wide brim hats (4-inch brim around the entire circumference of the hat) are also recommended for sun protection.  - Call for new or changing lesions.     Level of service outlined above   Procedures, orders, diagnosis for this visit:  INFLAMED SEBORRHEIC KERATOSIS Mid Back Symptomatic, irritating, patient would like treated. Destruction of lesion - Mid Back Complexity: simple   Destruction method: cryotherapy   Informed consent: discussed and consent obtained   Timeout:  patient name, date of birth, surgical site, and procedure verified Lesion destroyed using liquid nitrogen: Yes   Region frozen until ice ball extended beyond lesion: Yes   Cryo cycles: 1 or 2. Outcome: patient tolerated procedure well with no complications   Post-procedure details: wound care instructions given    Inflamed seborrheic keratosis -     Destruction of lesion    Return to clinic: Return in about 1 year (around 08/06/2025) for TBSE.  Documentation: I have reviewed the above documentation for accuracy and completeness, and I agree with the above.  Lauraine JAYSON Kanaris, MD

## 2024-08-06 NOTE — Patient Instructions (Addendum)

## 2024-08-25 ENCOUNTER — Other Ambulatory Visit (HOSPITAL_BASED_OUTPATIENT_CLINIC_OR_DEPARTMENT_OTHER): Payer: Self-pay | Admitting: Family Medicine

## 2024-08-25 DIAGNOSIS — Z8249 Family history of ischemic heart disease and other diseases of the circulatory system: Secondary | ICD-10-CM

## 2024-09-08 ENCOUNTER — Ambulatory Visit (INDEPENDENT_AMBULATORY_CARE_PROVIDER_SITE_OTHER)

## 2024-09-08 ENCOUNTER — Encounter: Payer: 59 | Admitting: Nurse Practitioner

## 2024-09-08 VITALS — BP 124/88 | HR 80 | Temp 97.8°F | Ht 74.25 in | Wt 277.0 lb

## 2024-09-08 DIAGNOSIS — Z0000 Encounter for general adult medical examination without abnormal findings: Secondary | ICD-10-CM | POA: Diagnosis not present

## 2024-09-08 DIAGNOSIS — Z113 Encounter for screening for infections with a predominantly sexual mode of transmission: Secondary | ICD-10-CM

## 2024-09-08 DIAGNOSIS — Z136 Encounter for screening for cardiovascular disorders: Secondary | ICD-10-CM | POA: Diagnosis not present

## 2024-09-08 DIAGNOSIS — E66811 Obesity, class 1: Secondary | ICD-10-CM | POA: Diagnosis not present

## 2024-09-08 LAB — LIPID PANEL
Cholesterol: 135 mg/dL (ref 0–200)
HDL: 27.9 mg/dL — ABNORMAL LOW (ref >39.00)
LDL Cholesterol: 73 mg/dL (ref 0–99)
NonHDL: 107.56
Total CHOL/HDL Ratio: 5
Triglycerides: 174 mg/dL — ABNORMAL HIGH (ref 0.0–149.0)
VLDL: 34.8 mg/dL (ref 0.0–40.0)

## 2024-09-08 LAB — HEMOGLOBIN A1C: Hgb A1c MFr Bld: 5.7 % (ref 4.6–6.5)

## 2024-09-08 NOTE — Progress Notes (Signed)
 Assessment & Plan:   This visit was conducted in person. The patient gave informed consent to the use of [employer] AI technology to record the contents of the encounter as documented below.  Assessment & Plan Adult Wellness Visit Routine wellness visit with stable blood pressure and no mood issues. Discussed colonoscopy screening to start at age 43 due to family history. Deferred hepatitis B immunity testing.  - Will start colonoscopy screening at age 43. - Deferred hepatitis B immunity testing for now.   - Ordered fasting labs including cholesterol and diabetes screening. - Scheduled follow-up visit in one year for next physical. - Will perform fasting labs one week prior to next physical.  Obesity, class 1 Class 1 obesity with recent weight loss of 7lbs over the past 44mo. Dietary modifications in place. Limited physical activity due to right Achilles swelling. Recommended exercise as tolerated.  - Continue current dietary modifications. - Encouraged use of elliptical machine when available. - Monitor weight and adjust lifestyle as needed.   I have personally reviewed and have noted: 1. The patient's medical and social history 2. Their use of alcohol, tobacco or illicit drugs 3. Their current medications and supplements 4. The patient's functional ability including ADL's, fall risks, home safety risks and hearing or visual impairment. 5. Diet and physical activities 6. Evidence for depression or mood disorder  Colonoscopy: Not yet due, no first degree relative with colon canmcer Immunizations: Declined Flu, covid.  Limits exercise due to achilles pain, would like to start elliptical Will follow up fasting labs    Follow-up: Return in about 1 year (around 09/08/2025) for CPE, fasting labs 1 wk prior.        Subjective:   Patient ID: Stuart Newman, male    DOB: 26-Jun-1981  Age: 43 y.o. MRN: [id number]  Patient Care Team: Bennett Reuben POUR, MD as PCP - General (Family  Medicine)   CC:  Chief Complaint  Patient presents with   Annual Exam     Stuart Newman is a 43 y.o. male who presents today for a complete physical exam.   Discussed the use of AI scribe software for clinical note transcription with the patient, who gave verbal consent to proceed.  History of Present Illness Stuart Newman is a 43 year old male who presents for an annual physical exam.  He has been fasting in preparation for lab work, including cholesterol and diabetes screening. He has lost weight from 284 pounds to 277 pounds since his last visit, attributing this to increased water intake and monitoring portion sizes and carbohydrate intake.  He experiences swelling in his right Achilles, which limits his ability to run. He manages this by walking.  He has no current issues with urination or bowel movements, having at least one bowel movement per day. No mood issues, feeling neither down nor anxious.  He does not smoke and has consumed approximately ten alcoholic drinks since September, describing them as 'very social.' He has a full bar at home but drinks occasionally.  He has a history of receiving various vaccinations in childhood but has not received a flu shot in recent years due to past adverse reactions. He is not interested in receiving a COVID booster at this time.  He is currently taking Crestor  and is nearing the need for a refill.  Family history includes pancreatic cancer in both grandparents and breast cancer in his mother. Genetic testing for specific genes related to these cancers was negative. There is a history of colon cancer  in his great-grandmother on his father's side.  He has a will with his wife, but it is not specific to healthcare decisions.   Advanced Directives Patient does not have advanced directives., He is Full code.   Depression Screening;    09/08/2024    8:19 AM 08/11/2020   10:16 AM 08/11/2019    9:52 AM 07/24/2018   10:11 AM 02/22/2015     7:09 PM  PHQ 2/9 Scores  PHQ - 2 Score 0 0 0 0 0     Anxiety Screening:     No data to display           ROS: Negative unless specifically indicated above in HPI.       Objective:     BP 124/88 (BP Location: Left Arm, Patient Position: Sitting, Cuff Size: Large)   Pulse 80   Temp 97.8 F (36.6 C) (Oral)   Ht 6' 2.25 (1.886 m)   Wt 277 lb (125.6 kg)   SpO2 100%   BMI 35.33 kg/m    Physical Exam  GENERAL: Alert, cooperative, well developed, no acute distress. Obese HEAD: Normocephalic atraumatic. EYES: Extraocular movements intact bilaterally, pupils round, equal and reactive to light bilaterally, conjunctivae normal bilaterally. EARS: Tympanic membrane, ear canal and external ear normal bilaterally. NOSE: No congestion or rhinorrhea, mucous membranes are moist. THROAT: Oral cavity normal, no oropharyngeal exudate or posterior oropharyngeal erythema. CARDIOVASCULAR: Normal heart rate and rhythm, S1 and S2 normal without murmurs. CHEST: Clear to auscultation bilaterally, no wheezes, rhonchi, or crackles. ABDOMEN: Soft, non-tender, non-distended, without organomegaly, normal bowel sounds. EXTREMITIES: No cyanosis or edema. NEUROLOGICAL: Oriented to person, place and time, no gait abnormalities, moves all extremities without gross motor or sensory deficit. NECK: Thyroid non-tender.        Nayali Talerico K Mahathi Pokorney, MD  09/08/24    Contains text generated by [employer].

## 2024-09-09 LAB — HIV ANTIBODY (ROUTINE TESTING W REFLEX)
HIV 1&2 Ab, 4th Generation: NONREACTIVE
HIV FINAL INTERPRETATION: NEGATIVE

## 2024-09-10 ENCOUNTER — Ambulatory Visit: Payer: Self-pay

## 2024-09-10 DIAGNOSIS — E782 Mixed hyperlipidemia: Secondary | ICD-10-CM

## 2024-09-12 MED ORDER — ROSUVASTATIN CALCIUM 10 MG PO TABS
10.0000 mg | ORAL_TABLET | Freq: Every day | ORAL | 3 refills | Status: AC
Start: 2024-09-12 — End: 2025-03-11

## 2024-09-16 ENCOUNTER — Ambulatory Visit (HOSPITAL_BASED_OUTPATIENT_CLINIC_OR_DEPARTMENT_OTHER)
Admission: RE | Admit: 2024-09-16 | Discharge: 2024-09-16 | Disposition: A | Discharge status: Home / Self Care | Payer: Self-pay | Source: Ambulatory Visit | Attending: Family Medicine | Admitting: Family Medicine

## 2024-09-16 DIAGNOSIS — Z8249 Family history of ischemic heart disease and other diseases of the circulatory system: Secondary | ICD-10-CM

## 2025-08-06 ENCOUNTER — Ambulatory Visit

## 2025-09-02 ENCOUNTER — Other Ambulatory Visit

## 2025-09-09 ENCOUNTER — Encounter
